# Patient Record
Sex: Female | Born: 1985
Health system: Southern US, Community
[De-identification: ages and names within clinical notes are randomized; demographics above are authoritative.]

## PROBLEM LIST (undated history)

## (undated) DIAGNOSIS — I10 Essential (primary) hypertension: Secondary | ICD-10-CM

## (undated) DIAGNOSIS — E282 Polycystic ovarian syndrome: Secondary | ICD-10-CM

## (undated) DIAGNOSIS — N970 Female infertility associated with anovulation: Secondary | ICD-10-CM

## (undated) DIAGNOSIS — N883 Incompetence of cervix uteri: Secondary | ICD-10-CM

## (undated) DIAGNOSIS — E079 Disorder of thyroid, unspecified: Secondary | ICD-10-CM

## (undated) DIAGNOSIS — E039 Hypothyroidism, unspecified: Secondary | ICD-10-CM

## (undated) DIAGNOSIS — K8020 Calculus of gallbladder without cholecystitis without obstruction: Secondary | ICD-10-CM

## (undated) HISTORY — DX: Hypothyroidism, unspecified: E03.9

## (undated) HISTORY — DX: Incompetence of cervix uteri: N88.3

## (undated) HISTORY — DX: Polycystic ovarian syndrome: E28.2

## (undated) HISTORY — PX: CERVICAL CERCLAGE: SHX1329

## (undated) HISTORY — DX: Calculus of gallbladder without cholecystitis without obstruction: K80.20

## (undated) HISTORY — DX: Female infertility associated with anovulation: N97.0

## (undated) HISTORY — DX: Essential (primary) hypertension: I10

---

## 2012-06-28 HISTORY — PX: CERVICAL CERCLAGE: SHX1329

## 2018-07-06 ENCOUNTER — Ambulatory Visit (HOSPITAL_COMMUNITY)
Admission: EM | Admit: 2018-07-06 | Discharge: 2018-07-06 | Disposition: A | Discharge status: Home / Self Care | Payer: BLUE CROSS/BLUE SHIELD | Attending: Family Medicine | Admitting: Family Medicine

## 2018-07-06 ENCOUNTER — Other Ambulatory Visit: Payer: Self-pay

## 2018-07-06 ENCOUNTER — Encounter (HOSPITAL_COMMUNITY): Payer: Self-pay

## 2018-07-06 DIAGNOSIS — R220 Localized swelling, mass and lump, head: Secondary | ICD-10-CM | POA: Diagnosis not present

## 2018-07-06 DIAGNOSIS — Z3202 Encounter for pregnancy test, result negative: Secondary | ICD-10-CM

## 2018-07-06 HISTORY — DX: Disorder of thyroid, unspecified: E07.9

## 2018-07-06 HISTORY — DX: Essential (primary) hypertension: I10

## 2018-07-06 LAB — POCT PREGNANCY, URINE: Preg Test, Ur: NEGATIVE

## 2018-07-06 MED ORDER — AMOXICILLIN-POT CLAVULANATE 875-125 MG PO TABS: 1.0000 | ORAL_TABLET | Freq: Two times a day (BID) | ORAL | 0 refills | Status: DC

## 2018-07-06 NOTE — ED Provider Notes (Signed)
[facility]    CSN: [id number] Arrival date & time: 07/06/18  1345     History   Chief Complaint Chief Complaint  Patient presents with  . Otalgia  . Sore Throat    HPI Angelica Birdena CrandallSrikanth Harris is a 33 y.o. female.   Patient is a 33 year old female with past medical history of hypertension, thyroid disease.  She presents with approximately 2 days of right facial pain just below the right ear and sore throat.  She has had some facial swelling also.  Feels as if her symptoms are getting worse.  She denies any fever, body aches, night sweats.  She denies any cough, congestion, runny nose.  She is having some pain with swallowing but not having any trouble swallowing or holding secretions.  No trismus.  Denies any dental pain or dental problems.   ROS per HPI      Past Medical History:  Diagnosis Date  . Hypertension   . Thyroid disease     There are no active problems to display for this patient.   History reviewed. No pertinent surgical history.  OB History   No obstetric history on file.      Home Medications    Prior to Admission medications   Medication Sig Start Date End Date Taking? Authorizing Provider  amlodipine-atorvastatin (CADUET) 10-20 MG tablet Take 1 tablet by mouth daily.   Yes [provider]  levothyroxine (SYNTHROID, LEVOTHROID) 125 MCG tablet Take 125 mcg by mouth daily before breakfast.   Yes [provider]  metFORMIN (GLUCOPHAGE) 500 MG tablet Take by mouth 2 (two) times daily with a meal.   Yes [provider]  amoxicillin-clavulanate (AUGMENTIN) 875-125 MG tablet Take 1 tablet by mouth every 12 (twelve) hours. 07/06/18   Janace ArisBast, Deanndra Kirley A, NP    Family History Family History  Problem Relation Age of Onset  . Hypertension Mother   . Diabetes Father   . Thyroid disease Father     Social History Social History   Tobacco Use  . Smoking status: Never Smoker  . Smokeless tobacco: Never Used    Substance Use Topics  . Alcohol use: Never    Frequency: Never  . Drug use: Never     Allergies   Patient has no known allergies.   Review of Systems Review of Systems   Physical Exam Triage Vital Signs ED Triage Vitals  Enc Vitals Group     BP 07/06/18 1410 (!) 150/111     Pulse Rate 07/06/18 1410 83     Resp 07/06/18 1410 18     Temp 07/06/18 1410 98.3 F (36.8 C)     Temp src --      SpO2 07/06/18 1410 100 %     Weight --      Height --      Head Circumference --      Peak Flow --      Pain Score 07/06/18 1404 7     Pain Loc --      Pain Edu? --      Excl. in GC? --    No data found.  Updated Vital Signs BP (!) 150/111 (BP Location: Right Arm)   Pulse 83   Temp 98.3 F (36.8 C)   Resp 18   LMP 04/23/2018   SpO2 100%   Visual Acuity Right Eye Distance:   Left Eye Distance:   Bilateral Distance:    Right Eye Near:   Left Eye Near:  Bilateral Near:     Physical Exam Vitals signs and nursing note reviewed.  Constitutional:      General: She is not in acute distress.    Appearance: She is well-developed. She is not ill-appearing, toxic-appearing or diaphoretic.  HENT:     Head: Normocephalic and atraumatic.     Comments: Mild swelling and tenderness just below the right ear extending slightly into jaw area.  No tenderness to mastoid or tragus. No trismus No gingival swelling, erythema or abscess    Right Ear: Tympanic membrane and ear canal normal.     Left Ear: Tympanic membrane and ear canal normal.     Mouth/Throat:     Tonsils: No tonsillar exudate. Swelling: 1+ on the right. 1+ on the left.  Eyes:     Conjunctiva/sclera: Conjunctivae normal.  Neck:     Musculoskeletal: Normal range of motion.  Cardiovascular:     Rate and Rhythm: Normal rate and regular rhythm.     Heart sounds: Normal heart sounds.  Pulmonary:     Effort: Pulmonary effort is normal.     Breath sounds: Normal breath sounds.  Lymphadenopathy:     Cervical:  Cervical adenopathy present.  Skin:    General: Skin is warm and dry.  Neurological:     Mental Status: She is alert.  Psychiatric:        Mood and Affect: Mood normal.      UC Treatments / Results  Labs (all labs ordered are listed, but only abnormal results are displayed) Labs Reviewed  POC URINE PREG, ED  POCT PREGNANCY, URINE    EKG None  Radiology No results found.  Procedures Procedures (including critical care time)  Medications Ordered in UC Medications - No data to display  Initial Impression / Assessment and Plan / UC Course  I have reviewed the triage vital signs and the nursing notes.  Pertinent labs & imaging results that were available during my care of the patient were reviewed by me and considered in my medical decision making (see chart for details).     We will treat for parotitis versus sialadenitis vs dental infection Will treat with Augmentin and have the patient monitor her symptoms.  Strict precautions that if her symptoms worsen she needs to reevaluated. If much worse she needs to to go to the ER for further management. She can take naproxen for pain and inflammation.  Follow up as needed for continued or worsening symptoms  Final Clinical Impressions(s) / UC Diagnoses   Final diagnoses:  Facial swelling     Discharge Instructions     We are treating for possible parotitis. Start taking the antibiotics today If worse or not better in the next few days please let use know. If the swelling gets significantly worse then you need to go to the ER.  You can also take naproxen 500 mg every 12 hours for pain and inflammation.     ED Prescriptions    Medication Sig Dispense Auth. Provider   amoxicillin-clavulanate (AUGMENTIN) 875-125 MG tablet Take 1 tablet by mouth every 12 (twelve) hours. 14 tablet Dahlia Byes A, NP     Controlled Substance Prescriptions [place] Controlled Substance Registry consulted? Not Applicable   Janace Aris,  NP 07/06/18 1708

## 2018-07-06 NOTE — Discharge Instructions (Addendum)
We are treating for possible parotitis. Start taking the antibiotics today If worse or not better in the next few days please let use know. If the swelling gets significantly worse then you need to go to the ER.  You can also take naproxen 500 mg every 12 hours for pain and inflammation.

## 2018-07-06 NOTE — ED Triage Notes (Signed)
Pt cc she has ear discomfort and sore throat. This started yesterday.

## 2018-07-17 ENCOUNTER — Ambulatory Visit: Payer: BLUE CROSS/BLUE SHIELD | Admitting: Family Medicine

## 2018-07-17 ENCOUNTER — Other Ambulatory Visit: Payer: Self-pay

## 2018-07-17 ENCOUNTER — Encounter: Payer: Self-pay | Admitting: Family Medicine

## 2018-07-17 VITALS — BP 136/76 | HR 91 | Temp 99.1°F | Resp 16 | Ht 63.0 in | Wt 164.2 lb

## 2018-07-17 DIAGNOSIS — E038 Other specified hypothyroidism: Secondary | ICD-10-CM

## 2018-07-17 DIAGNOSIS — K8020 Calculus of gallbladder without cholecystitis without obstruction: Secondary | ICD-10-CM

## 2018-07-17 DIAGNOSIS — E039 Hypothyroidism, unspecified: Secondary | ICD-10-CM | POA: Diagnosis not present

## 2018-07-17 DIAGNOSIS — I10 Essential (primary) hypertension: Secondary | ICD-10-CM | POA: Diagnosis not present

## 2018-07-17 DIAGNOSIS — E282 Polycystic ovarian syndrome: Secondary | ICD-10-CM

## 2018-07-17 DIAGNOSIS — N970 Female infertility associated with anovulation: Secondary | ICD-10-CM | POA: Diagnosis not present

## 2018-07-17 HISTORY — DX: Other specified hypothyroidism: E03.8

## 2018-07-17 HISTORY — DX: Essential (primary) hypertension: I10

## 2018-07-17 HISTORY — DX: Calculus of gallbladder without cholecystitis without obstruction: K80.20

## 2018-07-17 NOTE — Patient Instructions (Signed)
Please return in 6-8 weeks to recheck your blood pressure.  You can check your blood pressure at the pharmacy in the interim, write down the numbers and please bring them in for my review.   Please sign up for my chart.  Please start taking a multivitamin daily. This is important if you are trying to get pregnant because of the folic acid levels.   It was a pleasure meeting you today! Thank you for choosing [place] to meet your healthcare needs! I truly look forward to working with you. If you have any questions or concerns, please send me a message via Mychart or call the office at [phone].

## 2018-07-17 NOTE — Progress Notes (Signed)
Subjective  CC:  Chief Complaint  Patient presents with  . Establish Care    Was recently moved here in October. Last CPE was 02/2018  . Hypertension    Takes Amlodipine 5mg   . Hypothyroidism    Takes Levothyroxine  . PCOS    Metformin 500mg  daily    HPI: Angelica Harris is a 33 y.o. female who presents to [facility] today to establish care with me as a new patient.   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  I reviewed recent notes from her primary care provider.  Last physical was in September.  She reports normal lab work at that time.  He has history of hypertension on low-dose amlodipine.  She reports that it recently was checked and has been normal.  No complications of high blood pressure.  Tries to maintain a good weight although she is gained a little bit of weight recently.  History of subclinical hypothyroidism on low-dose supplementation, mainly due to help with fertility issues.  She and her husband are trying to have another child.  She reports her TSH was normal when last checked in September.  PCOS and infertility: On metformin for cycle regulation.  Trying to get pregnant.  First pregnancy was complicated by incompetent cervix status post cerclage and cesarean section.  She is a G2P10 1 1 with history of a SAB.  She has no history of diabetes or prediabetes.  She has a history of asymptomatic cholelithiasis found on an ultrasound.  Female wellness is up-to-date.  Immunizations are up-to-date.  Assessment  1. Essential hypertension   2. PCOS (polycystic ovarian syndrome)   3. Subclinical hypothyroidism   4. Infertility associated with anovulation   5. Calculus of gallbladder without cholecystitis without obstruction      Plan   Hypertension is fairly well controlled.  Continue current  medications.  Check outside of the office as needed.  Follow-up in 2 months for recheck.  Adjust medications at that time if indicated.  PCOS with infertility on metformin: Family-planning discussed.  Recommend multivitamin or prenatal vitamin with folic acid.  May need referral to reproductive endocrinologist.  Subclinical hypothyroidism: Continue low-dose supplementation.  Euthyroid clinically.  Recheck levels at her complete physical.  Reviewed blood work once I get it from her old office.  Asymptomatic gallstones: Monitor for now.  No further work-up indicated.  Follow up:  Return in about 8 weeks (around 09/11/2018) for follow up Hypertension. No orders of the defined types were placed in this encounter.  No orders of the defined types were placed in this encounter.    Depression screen PHQ 2/9 07/17/2018  Decreased Interest 0  Down, Depressed, Hopeless 0  PHQ - 2 Score 0    We updated and reviewed the patient's past history in detail and it is documented below.  Patient Active Problem List   Diagnosis Date Noted  . Subclinical hypothyroidism 07/17/2018  . Infertility associated with anovulation 07/17/2018  . Essential hypertension 07/17/2018  . Cholelithiases - asymptomatic 07/17/2018    By ultrasound   . PCOS (polycystic ovarian syndrome)    Health Maintenance  Topic Date Due  . HIV Screening  03/22/1999  . PAP SMEAR-Modifier  07/17/2020  . TETANUS/TDAP  02/23/2023  . INFLUENZA VACCINE  Completed    There is no immunization history on file for this patient. Current Meds  Medication Sig  . amLODipine (NORVASC) 2.5 MG tablet Take 1 tablet by mouth 2 (two) times daily.  . cholecalciferol (VITAMIN D3) 25 MCG (1000 UT) tablet Take 1,000 Units by mouth daily.  . ferrous sulfate 325 (65 FE) MG EC tablet Take 325 mg by mouth daily with breakfast.  . levothyroxine (SYNTHROID, LEVOTHROID) 25 MCG tablet Take 1 tablet by mouth once.  . loratadine (CLARITIN) 10 MG tablet Take 1  tablet by mouth daily.  . metFORMIN (GLUCOPHAGE) 500 MG tablet Take by mouth 2 (two) times daily with a meal.    Allergies: Patient has No Known Allergies. Past Medical History Patient  has a past medical history of Cervical incompetence, Cholelithiases - asymptomatic (07/17/2018), Essential hypertension (07/17/2018), Hypertension, Infertility associated with anovulation, PCOS (polycystic ovarian syndrome), Subclinical hypothyroidism (07/17/2018), and Thyroid disease. Past Surgical History Patient  has a past surgical history that includes Cervical cerclage and Cesarean section. Family History: Patient family history includes Diabetes in her father; Healthy in her daughter; Hypertension in her mother; Thyroid disease in her father. Social History:  Patient  reports that she has never smoked. She has never used smokeless tobacco. She reports that she does not drink alcohol or use drugs.  Review of Systems: Constitutional: negative for fever or malaise Ophthalmic: negative for photophobia, double vision or loss of vision Cardiovascular: negative for chest pain, dyspnea on exertion, or new LE swelling Respiratory: negative for SOB or persistent cough Gastrointestinal: negative for abdominal pain, change in bowel habits or melena Genitourinary: negative for dysuria or gross hematuria Musculoskeletal: negative for new gait disturbance or muscular weakness Integumentary: negative for new or persistent rashes Neurological: negative for TIA or stroke symptoms Psychiatric: negative for SI or delusions Allergic/Immunologic: negative for hives  Patient Care Team    Relationship Specialty Notifications Start End  Willow Ora, MD PCP - General Family Medicine  07/17/18     Objective  Vitals: BP 136/76   Pulse 91   Temp 99.1 F (37.3 C) (Oral)   Resp 16   Ht 5\' 3"  (1.6 m)   Wt 164 lb 3.2 oz (74.5 kg)   LMP 07/07/2018   SpO2 98%   BMI 29.09 kg/m  General:  Well developed, well  nourished, no acute distress  Psych:  Alert and oriented,normal mood and affect HEENT:  Normocephalic, atraumatic, non-icteric sclera, PERRL, oropharynx is without mass or exudate, supple neck without adenopathy, mass or thyromegaly Cardiovascular:  RRR without gallop, rub or murmur, nondisplaced PMI Respiratory:  Good breath sounds bilaterally, CTAB with normal respiratory effort Gastrointestinal: normal bowel sounds, soft, non-tender, no noted masses. No HSM MSK: no deformities, contusions. Joints are without erythema or swelling Skin:  Warm, no rashes or suspicious lesions noted Neurologic:    Mental status is normal. Gross motor and sensory exams are normal. Normal gait   Commons side effects, risks, benefits, and alternatives for medications and treatment plan prescribed today were discussed, and the patient expressed understanding of the given instructions. Patient is instructed to call or message via MyChart if he/she has any questions or concerns regarding our treatment plan. No barriers to understanding were identified. We discussed Red Flag symptoms and signs in detail. Patient expressed understanding regarding what to do in case of urgent or emergency type symptoms.   Medication list was reconciled, printed and provided to the patient in AVS. Patient instructions and summary information was reviewed with the patient as documented in the AVS. This note was prepared with assistance of Dragon voice  recognition software. Occasional wrong-word or sound-a-like substitutions may have occurred due to the inherent limitations of voice recognition software

## 2018-08-25 ENCOUNTER — Ambulatory Visit: Payer: Self-pay | Admitting: *Deleted

## 2018-08-25 NOTE — Telephone Encounter (Signed)
Patient called in and stated her daughter was diagnosed with the flu yesterday and wants to know if she can get a script for tamiflu    Patient is trying for pregnancy and she has not been using protection. Patient states she has had the flu shot this year. Patient's child is 35 years old and she will be the main caregiver.  Patient informed that the [facility] provider are ill and the office is not seeing patients today. Call to [facility] office as alternative resource- Phineas Semen has agreed to check with provider to see if it is feasible to Rx for patient. Patient is aware and will expect notification of response from provider.  Reason for Disposition . [1] Influenza EXPOSURE within last 48 hours (2 days) AND [2] NOT HIGH RISK AND [3] strongly requests antiviral medication  Answer Assessment - Initial Assessment Questions 1. TYPE of EXPOSURE: "How were you exposed?" (e.g., close contact, not a close contact)     Close contact- daughter 2. DATE of EXPOSURE: "When did the exposure occur?" (e.g., hour, days, weeks)     Diagnosed with flu today- confirmed 3. PREGNANCY: "Is there any chance you are pregnant?" "When was your last menstrual period?"     Yes- LMP- 08/07/18- patient actively trying for pregnancy 4. HIGH RISK for COMPLICATIONS: "Do you have any heart or lung problems? Do you have a weakened immune system?" (e.g., CHF, COPD, asthma, HIV positive, chemotherapy, renal failure, diabetes mellitus, sickle cell anemia)     High blood pressure  5. SYMPTOMS: "Do you have any symptoms?" (e.g., cough, fever, sore throat, difficulty breathing).     no  Protocols used: INFLUENZA EXPOSURE-A-AH

## 2018-08-28 NOTE — Telephone Encounter (Signed)
Please call to see if she remains fine. If doing fine and daughter is improving, would not recommend course of tamiflu for prevention at this time.  Apologize if call hadn't been handled in my absence.

## 2018-08-28 NOTE — Telephone Encounter (Signed)
She reports that her daughter is feeling better and that her and her husband both have not had any symptoms.

## 2018-09-04 ENCOUNTER — Encounter: Payer: Self-pay | Admitting: Family Medicine

## 2018-09-04 ENCOUNTER — Other Ambulatory Visit: Payer: Self-pay

## 2018-09-04 ENCOUNTER — Ambulatory Visit: Payer: BLUE CROSS/BLUE SHIELD | Admitting: Family Medicine

## 2018-09-04 VITALS — BP 122/84 | HR 93 | Temp 98.5°F | Resp 16 | Ht 63.0 in | Wt 164.8 lb

## 2018-09-04 DIAGNOSIS — N970 Female infertility associated with anovulation: Secondary | ICD-10-CM

## 2018-09-04 DIAGNOSIS — I10 Essential (primary) hypertension: Secondary | ICD-10-CM | POA: Diagnosis not present

## 2018-09-04 MED ORDER — LABETALOL HCL 100 MG PO TABS: 100.0000 mg | ORAL_TABLET | Freq: Two times a day (BID) | ORAL | 1 refills | Status: DC

## 2018-09-04 NOTE — Patient Instructions (Signed)
Please return in 6-8 weeks to recheck your blood pressure on the new medication.   Please stop the amlodipine.  Start labetalol twice a day. This medication is safe for use in pregnancy and should control your blood pressure.  Let me know if you have any problems.   If you have any questions or concerns, please don't hesitate to send me a message via MyChart or call the office at [phone]. Thank you for visiting with [place] today! It's our pleasure caring for you.

## 2018-09-04 NOTE — Progress Notes (Signed)
Subjective  CC:  Chief Complaint  Patient presents with  . Hypertension    HPI: Angelica Harris is a 33 y.o. female who presents to the office today to address the problems listed above in the chief complaint.  Hypertension f/u: Control is good . Pt reports she is doing well. taking medications as instructed, no medication side effects noted, no TIAs, no chest pain on exertion, no dyspnea on exertion, no swelling of ankles. She is trying to conceive though. Menstrual cycle is due tomorrow. She doesn't feel the typical predromal symptoms. She is hopeful. She denies adverse effects from his BP medications. Compliance with medication is good.   Assessment  1. Essential hypertension   2. Infertility associated with anovulation      Plan    Hypertension f/u: BP control is well controlled. However, due to trying to conceive, will change to labetalol 100 bid. Recheck 6-8 weeks.   Infertility history: rec checking home pregnancy test in 7 to 10 days if.  Has not started yet.  She is on a prenatal vitamin. Education regarding management of these chronic disease states was given. Management strategies discussed on successive visits include dietary and exercise recommendations, goals of achieving and maintaining IBW, and lifestyle modifications aiming for adequate sleep and minimizing stressors.   Follow up: Return in about 6 weeks (around 10/16/2018) for follow up Hypertension.  No orders of the defined types were placed in this encounter.  Meds ordered this encounter  Medications  . labetalol (NORMODYNE) 100 MG tablet    Sig: Take 1 tablet (100 mg total) by mouth 2 (two) times daily.    Dispense:  180 tablet    Refill:  1      BP Readings from Last 3 Encounters:  09/04/18 122/84  07/17/18 136/76  07/06/18 (!) 150/111   Wt Readings from Last 3 Encounters:  09/04/18 164 lb 12.8 oz (74.8 kg)  07/17/18 164 lb 3.2 oz (74.5 kg)    I reviewed the patients updated PMH,  FH, and SocHx.    Patient Active Problem List   Diagnosis Date Noted  . Subclinical hypothyroidism 07/17/2018    Priority: High  . Essential hypertension 07/17/2018    Priority: High  . Infertility associated with anovulation 07/17/2018    Priority: Medium  . Cholelithiases - asymptomatic 07/17/2018    Priority: Medium  . PCOS (polycystic ovarian syndrome)     Priority: Medium    Allergies: Patient has no known allergies.  Social History: Patient  reports that she has never smoked. She has never used smokeless tobacco. She reports that she does not drink alcohol or use drugs.  Current Meds  Medication Sig  . cholecalciferol (VITAMIN D3) 25 MCG (1000 UT) tablet Take 1,000 Units by mouth daily.  . ferrous sulfate 325 (65 FE) MG EC tablet Take 325 mg by mouth daily with breakfast.  . levothyroxine (SYNTHROID, LEVOTHROID) 25 MCG tablet Take 1 tablet by mouth once.  . loratadine (CLARITIN) 10 MG tablet Take 1 tablet by mouth daily.  . metFORMIN (GLUCOPHAGE) 500 MG tablet Take by mouth 2 (two) times daily with a meal.  . [DISCONTINUED] amLODipine (NORVASC) 2.5 MG tablet Take 1 tablet by mouth 2 (two) times daily.    Review of Systems: Cardiovascular: negative for chest pain, palpitations, leg swelling, orthopnea Respiratory: negative for SOB, wheezing or persistent cough Gastrointestinal: negative for abdominal pain Genitourinary: negative for dysuria or gross hematuria  Objective  Vitals: BP 122/84   Pulse 93  Temp 98.5 F (36.9 C) (Oral)   Resp 16   Ht 5\' 3"  (1.6 m)   Wt 164 lb 12.8 oz (74.8 kg)   LMP 08/07/2018   SpO2 99%   BMI 29.19 kg/m  General: no acute distress  Psych:  Alert and oriented, normal mood and affect HEENT:  Normocephalic, atraumatic, supple neck  Cardiovascular:  RRR without murmur. no edema Respiratory:  Good breath sounds bilaterally, CTAB with normal respiratory effort Skin:  Warm, no rashes Neurologic:   Mental status is normal  Commons  side effects, risks, benefits, and alternatives for medications and treatment plan prescribed today were discussed, and the patient expressed understanding of the given instructions. Patient is instructed to call or message via MyChart if he/she has any questions or concerns regarding our treatment plan. No barriers to understanding were identified. We discussed Red Flag symptoms and signs in detail. Patient expressed understanding regarding what to do in case of urgent or emergency type symptoms.   Medication list was reconciled, printed and provided to the patient in AVS. Patient instructions and summary information was reviewed with the patient as documented in the AVS. This note was prepared with assistance of Dragon voice recognition software. Occasional wrong-word or sound-a-like substitutions may have occurred due to the inherent limitations of voice recognition software

## 2018-09-06 ENCOUNTER — Telehealth: Payer: Self-pay | Admitting: Family Medicine

## 2018-09-06 NOTE — Telephone Encounter (Signed)
She states that she has not taking this medication for 7-8 months, she had had dizziness and fatigue. She does not want to try medication again.

## 2018-09-06 NOTE — Telephone Encounter (Signed)
Can she remember how long ago it was and would she be willing to retry it?? This is a safe bp medication for pregnancy.  Dr. Mardelle Matte

## 2018-09-06 NOTE — Telephone Encounter (Signed)
Please advise 

## 2018-09-06 NOTE — Telephone Encounter (Signed)
Copied from CRM [id number]. Topic: Quick Communication - Rx Refill/Question >> Sep 06, 2018  9:49 AM [name] wrote: Medication: a different bp med  Pt states the dr changed her bp med to labetalol (NORMODYNE) 100 MG tablet on 3/09.  Pt states she now remembers she has tried this med in the past, and it did not work for her.   She did not fell good with it. Pt has not picked it [facility] from [facility].  Requesting a different medication  [phone] [place], [place] - [address] [phone] (Phone) [phone] (Fax)

## 2018-09-07 MED ORDER — NIFEDIPINE ER OSMOTIC RELEASE 30 MG PO TB24: 30.0000 mg | ORAL_TABLET | Freq: Every day | ORAL | 1 refills | Status: DC

## 2018-09-07 NOTE — Telephone Encounter (Signed)
Ok. Changed to procardia xl 30mg  daily. F/u as directed.

## 2018-09-07 NOTE — Telephone Encounter (Signed)
Patient aware of new medication called into pharmacy. ?

## 2018-10-19 ENCOUNTER — Other Ambulatory Visit: Payer: Self-pay

## 2018-10-19 ENCOUNTER — Encounter: Payer: Self-pay | Admitting: Family Medicine

## 2018-10-19 ENCOUNTER — Ambulatory Visit (INDEPENDENT_AMBULATORY_CARE_PROVIDER_SITE_OTHER): Payer: BLUE CROSS/BLUE SHIELD | Admitting: Family Medicine

## 2018-10-19 DIAGNOSIS — N970 Female infertility associated with anovulation: Secondary | ICD-10-CM | POA: Diagnosis not present

## 2018-10-19 DIAGNOSIS — I10 Essential (primary) hypertension: Secondary | ICD-10-CM | POA: Diagnosis not present

## 2018-10-19 DIAGNOSIS — E282 Polycystic ovarian syndrome: Secondary | ICD-10-CM

## 2018-10-19 NOTE — Progress Notes (Signed)
Virtual Visit via Video Note  Subjective  CC:  Chief Complaint  Patient presents with  . Hypertension     I connected with Angelica Harris on 10/19/18 at 10:40 AM EDT by a video enabled telemedicine application and verified that I am speaking with the correct person using two identifiers. Location patient: Home [facility] provider[facility] [place] [place] Persons participating in the virtual visit: Angelica Harris, Angelica Oraamille L Laiylah Roettger, MD Rita Oharaiara Simmons, CMA  I discussed the limitations of evaluation and management by telemedicine and the availability of in person appointments. The patient expressed understanding and agreed to proceed. HPI: Angelica Harris is a 33 y.o. female who was contacted today to address the problems listed above in the chief complaint. Hypertension f/u: f/u for bp med change: stopped amlodipine and started procardia since trying to conceive. Control is unknown, hasn't checked her bp since starting the procardia. Pt reports she is doing well. taking medications as instructed, no medication side effects noted, no TIAs, no chest pain on exertion, no dyspnea on exertion, no swelling of ankles. Feels well on new meds. BP had been well controlled on low dose amlodipine (doesn't tolerate aldomet).  She denies adverse effects from his BP medications. Compliance with medication is good.   LMP 6 weeks ago; one negative Upreg at home and no sxs of pregnancy.  BP Readings from Last 3 Encounters:  09/04/18 122/84  07/17/18 136/76  07/06/18 (!) 150/111   Wt Readings from Last 3 Encounters:  09/04/18 164 lb 12.8 oz (74.8 kg)  07/17/18 164 lb 3.2 oz (74.5 kg)    Assessment  1. Essential hypertension   2. PCOS (polycystic ovarian syndrome)   3. Infertility associated with anovulation      Plan   HTN:  Tolerating procardia; she will purchase a home bp cuff and send my some readings. I will adjust meds if needed based on  those readings. Continue same dose for now.   PCOS on metformin with occ missed menses. Rec rechecking Upreg to be certain.   Infertility on MVI I discussed the assessment and treatment plan with the patient. The patient was provided an opportunity to ask questions and all were answered. The patient agreed with the plan and demonstrated an understanding of the instructions.   The patient was advised to call back or seek an in-person evaluation if the symptoms worsen or if the condition fails to improve as anticipated. Follow up: Return in about 5 months (around 03/21/2019) for complete physical.  Visit date not found  No orders of the defined types were placed in this encounter.     I reviewed the patients updated PMH, FH, and SocHx.    Patient Active Problem List   Diagnosis Date Noted  . Subclinical hypothyroidism 07/17/2018    Priority: High  . Essential hypertension 07/17/2018    Priority: High  . Infertility associated with anovulation 07/17/2018    Priority: Medium  . Cholelithiases - asymptomatic 07/17/2018    Priority: Medium  . PCOS (polycystic ovarian syndrome)     Priority: Medium   Current Meds  Medication Sig  . cholecalciferol (VITAMIN D3) 25 MCG (1000 UT) tablet Take 1,000 Units by mouth daily.  . ferrous sulfate 325 (65 FE) MG EC tablet Take 325 mg by mouth daily with breakfast.  . levothyroxine (SYNTHROID, LEVOTHROID) 25 MCG tablet Take 1 tablet by mouth once.  . loratadine (CLARITIN) 10 MG tablet Take 1 tablet by mouth daily.  .Marland Kitchen  metFORMIN (GLUCOPHAGE) 500 MG tablet Take by mouth 2 (two) times daily with a meal.  . NIFEdipine (PROCARDIA-XL/NIFEDICAL-XL) 30 MG 24 hr tablet Take 1 tablet (30 mg total) by mouth daily.    Allergies: Patient has No Known Allergies. Family History: Patient family history includes Diabetes in her father; Healthy in her daughter; Hypertension in her mother; Thyroid disease in her father. Social History:  Patient  reports that she  has never smoked. She has never used smokeless tobacco. She reports that she does not drink alcohol or use drugs.  Review of Systems: Constitutional: Negative for fever malaise or anorexia Cardiovascular: negative for chest pain Respiratory: negative for SOB or persistent cough Gastrointestinal: negative for abdominal pain  OBJECTIVE Vitals: LMP 07/31/2018  General: no acute distress , A&Ox3 Appears well.   Angelica Ora, MD

## 2018-10-19 NOTE — Patient Instructions (Signed)
Please check your blood pressure on the new medication and send me a message.   Return in September for your complete physical.

## 2019-02-13 ENCOUNTER — Encounter: Payer: Self-pay | Admitting: Family Medicine

## 2019-02-13 MED ORDER — LEVOTHYROXINE SODIUM 25 MCG PO TABS: 25.0000 ug | ORAL_TABLET | Freq: Once | ORAL | 1 refills | Status: DC

## 2019-02-13 MED ORDER — METFORMIN HCL 500 MG PO TABS: 500.0000 mg | ORAL_TABLET | Freq: Two times a day (BID) | ORAL | 1 refills | Status: DC

## 2019-02-15 ENCOUNTER — Other Ambulatory Visit: Payer: Self-pay | Admitting: *Deleted

## 2019-02-15 MED ORDER — METFORMIN HCL 500 MG PO TABS: 500.0000 mg | ORAL_TABLET | Freq: Two times a day (BID) | ORAL | 1 refills | Status: DC

## 2019-03-17 ENCOUNTER — Encounter: Payer: Self-pay | Admitting: Family Medicine

## 2019-03-21 ENCOUNTER — Encounter: Payer: BLUE CROSS/BLUE SHIELD | Admitting: Family Medicine

## 2019-03-23 ENCOUNTER — Other Ambulatory Visit: Payer: Self-pay

## 2019-03-23 ENCOUNTER — Encounter: Payer: Self-pay | Admitting: Family Medicine

## 2019-03-23 ENCOUNTER — Ambulatory Visit (INDEPENDENT_AMBULATORY_CARE_PROVIDER_SITE_OTHER): Payer: BC Managed Care – PPO | Admitting: Family Medicine

## 2019-03-23 VITALS — BP 153/108 | HR 98 | Temp 98.2°F | Resp 16 | Ht 63.0 in | Wt 162.8 lb

## 2019-03-23 DIAGNOSIS — E039 Hypothyroidism, unspecified: Secondary | ICD-10-CM

## 2019-03-23 DIAGNOSIS — Z0000 Encounter for general adult medical examination without abnormal findings: Secondary | ICD-10-CM | POA: Diagnosis not present

## 2019-03-23 DIAGNOSIS — Z23 Encounter for immunization: Secondary | ICD-10-CM | POA: Diagnosis not present

## 2019-03-23 DIAGNOSIS — I10 Essential (primary) hypertension: Secondary | ICD-10-CM

## 2019-03-23 DIAGNOSIS — E038 Other specified hypothyroidism: Secondary | ICD-10-CM

## 2019-03-23 LAB — CBC WITH DIFFERENTIAL/PLATELET
Basophils Absolute: 0 10*3/uL (ref 0.0–0.1)
Basophils Relative: 0.3 % (ref 0.0–3.0)
Eosinophils Absolute: 0.2 10*3/uL (ref 0.0–0.7)
Eosinophils Relative: 2.1 % (ref 0.0–5.0)
HCT: 39.8 % (ref 36.0–46.0)
Hemoglobin: 13.4 g/dL (ref 12.0–15.0)
Lymphocytes Relative: 28.7 % (ref 12.0–46.0)
Lymphs Abs: 2.4 10*3/uL (ref 0.7–4.0)
MCHC: 33.7 g/dL (ref 30.0–36.0)
MCV: 88.5 fl (ref 78.0–100.0)
Monocytes Absolute: 0.6 10*3/uL (ref 0.1–1.0)
Monocytes Relative: 7.6 % (ref 3.0–12.0)
Neutro Abs: 5.1 10*3/uL (ref 1.4–7.7)
Neutrophils Relative %: 61.3 % (ref 43.0–77.0)
Platelets: 326 10*3/uL (ref 150.0–400.0)
RBC: 4.5 Mil/uL (ref 3.87–5.11)
RDW: 12.7 % (ref 11.5–15.5)
WBC: 8.3 10*3/uL (ref 4.0–10.5)

## 2019-03-23 LAB — COMPREHENSIVE METABOLIC PANEL
ALT: 44 U/L — ABNORMAL HIGH (ref 0–35)
AST: 29 U/L (ref 0–37)
Albumin: 4.5 g/dL (ref 3.5–5.2)
Alkaline Phosphatase: 51 U/L (ref 39–117)
BUN: 8 mg/dL (ref 6–23)
CO2: 27 mEq/L (ref 19–32)
Calcium: 10.5 mg/dL (ref 8.4–10.5)
Chloride: 100 mEq/L (ref 96–112)
Creatinine, Ser: 0.65 mg/dL (ref 0.40–1.20)
GFR: 103.73 mL/min (ref >60.00)
Glucose, Bld: 132 mg/dL — ABNORMAL HIGH (ref 70–99)
Potassium: 3.9 mEq/L (ref 3.5–5.1)
Sodium: 139 mEq/L (ref 135–145)
Total Bilirubin: 0.3 mg/dL (ref 0.2–1.2)
Total Protein: 6.9 g/dL (ref 6.0–8.3)

## 2019-03-23 LAB — TSH: TSH: 3.3 u[IU]/mL (ref 0.35–4.50)

## 2019-03-23 LAB — LDL CHOLESTEROL, DIRECT: Direct LDL: 115 mg/dL

## 2019-03-23 LAB — LIPID PANEL
Cholesterol: 174 mg/dL (ref 0–200)
HDL: 40.3 mg/dL (ref >39.00)
NonHDL: 134.02
Total CHOL/HDL Ratio: 4
Triglycerides: 365 mg/dL — ABNORMAL HIGH (ref 0.0–149.0)
VLDL: 73 mg/dL — ABNORMAL HIGH (ref 0.0–40.0)

## 2019-03-23 MED ORDER — NIFEDIPINE ER OSMOTIC RELEASE 60 MG PO TB24: 60.0000 mg | ORAL_TABLET | Freq: Every day | ORAL | 3 refills | Status: DC

## 2019-03-23 NOTE — Progress Notes (Signed)
Subjective  Chief Complaint  Patient presents with  . Annual Exam    Reports sh eis having white discharge instead of cycle  . Hypertension    Past 2 weeks BPs have been high, highest has been 171/115.Marland Kitchen Denies headaches and dizzines but, reports fatigue    HPI: Angelica Harris is a 33 y.o. female who presents to [place] at [place] today for a Female Wellness Visit.  She also has the concerns and/or needs as listed above in the chief complaint. These will be addressed in addition to the Health Maintenance Visit.   Wellness Visit: annual visit with health maintenance review and exam without Pap   Health maintenance: Pap smear up-to-date.  Feeling well.  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 Chronic disease management visit and/or acute problem visit:  Hypertension: We changed to Procardia 30 mg daily blood pressures are running elevated as noted above.  Averaging 140s over 100-110.  Asymptomatic.  Taking medications regularly.  Changed his pregnancy safe medication however she is not currently trying to conceive due to the above stressors.  Hypothyroidism stable without symptoms of low or high thyroid. Assessment  1. Annual physical exam   2. Essential hypertension   3. Subclinical hypothyroidism   4. Need for immunization against influenza      Plan  Female Wellness Visit:  Age appropriate Health Maintenance and Prevention measures were discussed with patient. Included topics are cancer screening recommendations, ways to keep healthy (see AVS) including dietary and exercise recommendations, regular eye and dental care, use of seat belts, and avoidance of moderate alcohol use and tobacco use.   BMI: discussed patient's BMI and encouraged positive lifestyle modifications to help get to or maintain a target BMI.  HM needs and immunizations were addressed and ordered. See  below for orders. See HM and immunization section for updates. Flu shot today  Routine labs and screening tests ordered including cmp, cbc and lipids where appropriate.  Discussed recommendations regarding Vit D and calcium supplementation (see AVS)  Chronic disease f/u and/or acute problem visit: (deemed necessary to be done in addition to the wellness visit):  HTN:  Uncontrolled. Increase dose of procardia and monitor at home. May increase in 3-4 weeks if remains above goal. Check renal function and electrolytes.  Hypothyroidism: recheck levels.   Follow up: Return in about 3 months (around 06/22/2019) for follow up Hypertension.   Orders Placed This Encounter  Procedures  . Flu Vaccine QUAD 36+ mos IM  . CBC with Differential/Platelet  . Comprehensive metabolic panel  . Lipid panel  . HIV Antibody (routine testing w rflx)  . TSH  . LDL cholesterol, direct   Meds ordered this encounter  Medications  . NIFEdipine (PROCARDIA XL/NIFEDICAL XL) 60 MG 24 hr tablet    Sig: Take 1 tablet (60 mg total) by mouth daily.    Dispense:  90 tablet    Refill:  3      Lifestyle: Body mass index is 28.84 kg/m. Wt Readings from Last 3 Encounters:  03/23/19 162 lb 12.8 oz (73.8 kg)  09/04/18 164 lb 12.8 oz (74.8 kg)  07/17/18 164 lb 3.2 oz (74.5 kg)   Patient Active Problem List   Diagnosis Date Noted  . Subclinical hypothyroidism 07/17/2018    Priority: High  . Essential hypertension 07/17/2018    Priority: High  . Infertility associated with anovulation 07/17/2018    Priority: Medium  .  Cholelithiases - asymptomatic 07/17/2018    Priority: Medium    By ultrasound   . PCOS (polycystic ovarian syndrome)     Priority: Medium   Health Maintenance  Topic Date Due  . HIV Screening  03/22/1999  . PAP SMEAR-Modifier  07/17/2020  . TETANUS/TDAP  02/23/2023  . INFLUENZA VACCINE  Completed   Immunization History  Administered Date(s) Administered  . Influenza,inj,Quad PF,6+ Mos  03/23/2019   We updated and reviewed the patient's past history in detail and it is documented below. Allergies: Patient  reports no history of alcohol use. Past Medical History Patient  has a past medical history of Cervical incompetence, Cholelithiases - asymptomatic (07/17/2018), Essential hypertension (07/17/2018), Hypertension, Infertility associated with anovulation, PCOS (polycystic ovarian syndrome), Subclinical hypothyroidism (07/17/2018), and Thyroid disease. Past Surgical History Patient  has a past surgical history that includes Cervical cerclage and Cesarean section. Social History   Socioeconomic History  . Marital status: Married    Spouse name: Not on file  . Number of children: 1  . Years of education: Not on file  . Highest education level: Not on file  Occupational History  . Occupation: stay at home mom  Social Needs  . Financial resource strain: Not on file  . Food insecurity    Worry: Not on file    Inability: Not on file  . Transportation needs    Medical: Not on file    Non-medical: Not on file  Tobacco Use  . Smoking status: Never Smoker  . Smokeless tobacco: Never Used  Substance and Sexual Activity  . Alcohol use: Never    Frequency: Never  . Drug use: Never  . Sexual activity: Yes    Birth control/protection: None  Lifestyle  . Physical activity    Days per week: Not on file    Minutes per session: Not on file  . Stress: Not on file  Relationships  . Social Musician on phone: Not on file    Gets together: Not on file    Attends religious service: Not on file    Active member of club or organization: Not on file    Attends meetings of clubs or organizations: Not on file    Relationship status: Not on file  Other Topics Concern  . Not on file  Social History Narrative  . Not on file   Family History  Problem Relation Age of Onset  . Hypertension Mother   . Diabetes Father   . Thyroid disease Father   . Healthy Daughter      Review of Systems: Constitutional: negative for fever or malaise Ophthalmic: negative for photophobia, double vision or loss of vision Cardiovascular: negative for chest pain, dyspnea on exertion, or new LE swelling Respiratory: negative for SOB or persistent cough Gastrointestinal: negative for abdominal pain, change in bowel habits or melena Genitourinary: negative for dysuria or gross hematuria, no abnormal uterine bleeding or disharge Musculoskeletal: negative for new gait disturbance or muscular weakness Integumentary: negative for new or persistent rashes, no breast lumps Neurological: negative for TIA or stroke symptoms Psychiatric: negative for SI or delusions Allergic/Immunologic: negative for hives  Patient Care Team    Relationship Specialty Notifications Start End  Willow Ora, MD PCP - General Family Medicine  07/17/18     Objective  Vitals: BP (!) 153/108 Comment: Home cuff (In Office)  Pulse 98   Temp 98.2 F (36.8 C) (Tympanic)   Resp 16   Ht 5\' 3"  (1.6  m)   Wt 162 lb 12.8 oz (73.8 kg)   LMP 02/17/2019   SpO2 99%   BMI 28.84 kg/m  General:  Well developed, well nourished, no acute distress  Psych:  Alert and orientedx3,normal mood and affect HEENT:  Normocephalic, atraumatic, non-icteric sclera, PERRL, oropharynx is clear without mass or exudate, supple neck without adenopathy, mass or thyromegaly Cardiovascular:  Normal S1, S2, RRR without gallop, rub or murmur, nondisplaced PMI Respiratory:  Good breath sounds bilaterally, CTAB with normal respiratory effort Gastrointestinal: normal bowel sounds, soft, non-tender, no noted masses. No HSM MSK: no deformities, contusions. Joints are without erythema or swelling. Spine and CVA region are nontender Skin:  Warm, no rashes or suspicious lesions noted Neurologic:    Mental status is normal. CN 2-11 are normal. Gross motor and sensory exams are normal. Normal gait. No tremor Breast Exam: No mass, skin retraction  or nipple discharge is appreciated in either breast. No axillary adenopathy. Fibrocystic changes are not noted     Commons side effects, risks, benefits, and alternatives for medications and treatment plan prescribed today were discussed, and the patient expressed understanding of the given instructions. Patient is instructed to call or message via MyChart if he/she has any questions or concerns regarding our treatment plan. No barriers to understanding were identified. We discussed Red Flag symptoms and signs in detail. Patient expressed understanding regarding what to do in case of urgent or emergency type symptoms.   Medication list was reconciled, printed and provided to the patient in AVS. Patient instructions and summary information was reviewed with the patient as documented in the AVS. This note was prepared with assistance of Dragon voice recognition software. Occasional wrong-word or sound-a-like substitutions may have occurred due to the inherent limitations of voice recognition software

## 2019-03-23 NOTE — Patient Instructions (Addendum)
Please return in 3 months for follow up of your hypertension.  I will release your lab results to you on your MyChart account with further instructions. Please reply with any questions.   Today you were given your flu vaccination.   If you have any questions or concerns, please don't hesitate to send me a message via MyChart or call the office at [phone]. Thank you for visiting with [place] today! It's our pleasure caring for you.   Preventive Care 44-13 Years Old, Female Preventive care refers to visits with your health care provider and lifestyle choices that can promote health and wellness. This includes:  A yearly physical exam. This may also be called an annual well check.  Regular dental visits and eye exams.  Immunizations.  Screening for certain conditions.  Healthy lifestyle choices, such as eating a healthy diet, getting regular exercise, not using drugs or products that contain nicotine and tobacco, and limiting alcohol use. What can I expect for my preventive care visit? Physical exam Your health care provider will check your:  Height and weight. This may be used to calculate body mass index (BMI), which tells if you are at a healthy weight.  Heart rate and blood pressure.  Skin for abnormal spots. Counseling Your health care provider may ask you questions about your:  Alcohol, tobacco, and drug use.  Emotional well-being.  Home and relationship well-being.  Sexual activity.  Eating habits.  Work and work Statistician.  Method of birth control.  Menstrual cycle.  Pregnancy history. What immunizations do I need?  Influenza (flu) vaccine  This is recommended every year. Tetanus, diphtheria, and pertussis (Tdap) vaccine  You may need a Td booster every 10 years. Varicella (chickenpox) vaccine  You may need this if you have not been vaccinated. Human papillomavirus (HPV) vaccine  If recommended by your health care provider, you may need three  doses over 6 months. Measles, mumps, and rubella (MMR) vaccine  You may need at least one dose of MMR. You may also need a second dose. Meningococcal conjugate (MenACWY) vaccine  One dose is recommended if you are age 73-21 years and a first-year college student living in a residence hall, or if you have one of several medical conditions. You may also need additional booster doses. Pneumococcal conjugate (PCV13) vaccine  You may need this if you have certain conditions and were not previously vaccinated. Pneumococcal polysaccharide (PPSV23) vaccine  You may need one or two doses if you smoke cigarettes or if you have certain conditions. Hepatitis A vaccine  You may need this if you have certain conditions or if you travel or work in places where you may be exposed to hepatitis A. Hepatitis B vaccine  You may need this if you have certain conditions or if you travel or work in places where you may be exposed to hepatitis B. Haemophilus influenzae type b (Hib) vaccine  You may need this if you have certain conditions. You may receive vaccines as individual doses or as more than one vaccine together in one shot (combination vaccines). Talk with your health care provider about the risks and benefits of combination vaccines. What tests do I need?  Blood tests  Lipid and cholesterol levels. These may be checked every 5 years starting at age 43.  Hepatitis C test.  Hepatitis B test. Screening  Diabetes screening. This is done by checking your blood sugar (glucose) after you have not eaten for a while (fasting).  Sexually transmitted disease (STD) testing.  BRCA-related cancer screening. This may be done if you have a family history of breast, ovarian, tubal, or peritoneal cancers.  Pelvic exam and Pap test. This may be done every 3 years starting at age 26. Starting at age 66, this may be done every 5 years if you have a Pap test in combination with an HPV test. Talk with your  health care provider about your test results, treatment options, and if necessary, the need for more tests. Follow these instructions at home: Eating and drinking   Eat a diet that includes fresh fruits and vegetables, whole grains, lean protein, and low-fat dairy.  Take vitamin and mineral supplements as recommended by your health care provider.  Do not drink alcohol if: ? Your health care provider tells you not to drink. ? You are pregnant, may be pregnant, or are planning to become pregnant.  If you drink alcohol: ? Limit how much you have to 0-1 drink a day. ? Be aware of how much alcohol is in your drink. In the [place] one drink equals one 12 oz bottle of beer (355 mL), one 5 oz glass of wine (148 mL), or one 1 oz glass of hard liquor (44 mL). Lifestyle  Take daily care of your teeth and gums.  Stay active. Exercise for at least 30 minutes on 5 or more days each week.  Do not use any products that contain nicotine or tobacco, such as cigarettes, e-cigarettes, and chewing tobacco. If you need help quitting, ask your health care provider.  If you are sexually active, practice safe sex. Use a condom or other form of birth control (contraception) in order to prevent pregnancy and STIs (sexually transmitted infections). If you plan to become pregnant, see your health care provider for a preconception visit. What's next?  Visit your health care provider once a year for a well check visit.  Ask your health care provider how often you should have your eyes and teeth checked.  Stay up to date on all vaccines. This information is not intended to replace advice given to you by your health care provider. Make sure you discuss any questions you have with your health care provider. Document Released: 08/10/2001 Document Revised: 02/23/2018 Document Reviewed: 02/23/2018 Elsevier Patient Education  2020 [employer].

## 2019-03-24 LAB — HIV ANTIBODY (ROUTINE TESTING W REFLEX): HIV 1&2 Ab, 4th Generation: NONREACTIVE

## 2019-03-26 ENCOUNTER — Other Ambulatory Visit: Payer: Self-pay | Admitting: *Deleted

## 2019-03-26 MED ORDER — METFORMIN HCL 500 MG PO TABS: 500.0000 mg | ORAL_TABLET | Freq: Two times a day (BID) | ORAL | 3 refills | Status: DC

## 2019-03-26 MED ORDER — LEVOTHYROXINE SODIUM 25 MCG PO TABS: 25.0000 ug | ORAL_TABLET | Freq: Once | ORAL | 3 refills | Status: DC

## 2019-04-08 ENCOUNTER — Other Ambulatory Visit: Payer: Self-pay | Admitting: Family Medicine

## 2019-06-13 ENCOUNTER — Other Ambulatory Visit: Payer: Self-pay

## 2019-06-13 MED ORDER — NIFEDIPINE ER OSMOTIC RELEASE 60 MG PO TB24: 60.0000 mg | ORAL_TABLET | Freq: Every day | ORAL | 3 refills | Status: DC

## 2019-06-19 ENCOUNTER — Ambulatory Visit: Payer: BC Managed Care – PPO | Admitting: Family Medicine

## 2019-08-08 ENCOUNTER — Encounter: Payer: Self-pay | Admitting: Family Medicine

## 2019-09-17 ENCOUNTER — Encounter: Payer: Self-pay | Admitting: Family Medicine

## 2019-09-17 ENCOUNTER — Ambulatory Visit: Payer: BC Managed Care – PPO | Admitting: Family Medicine

## 2019-09-17 ENCOUNTER — Other Ambulatory Visit: Payer: Self-pay

## 2019-09-17 VITALS — BP 148/76 | HR 76 | Temp 98.1°F | Ht 63.0 in | Wt 162.0 lb

## 2019-09-17 DIAGNOSIS — E282 Polycystic ovarian syndrome: Secondary | ICD-10-CM | POA: Diagnosis not present

## 2019-09-17 DIAGNOSIS — I10 Essential (primary) hypertension: Secondary | ICD-10-CM | POA: Diagnosis not present

## 2019-09-17 DIAGNOSIS — N930 Postcoital and contact bleeding: Secondary | ICD-10-CM

## 2019-09-17 NOTE — Progress Notes (Signed)
Subjective  CC:  Chief Complaint  Patient presents with  . Hypertension    does not check at home. denies HA, dizziness, or visual changes  . Vaginal Bleeding    on and off post intercourse for about 5 months. irregular periods. sex is not painful    HPI: Angelica Harris is a 34 y.o. female who presents to the office today to address the problems listed above in the chief complaint.  Hypertension f/u: Control is good  but patient hasn't checked her readings at home. Feels fine. Pt reports she is doing well. taking medications as instructed, no medication side effects noted, no TIAs, no chest pain on exertion, no dyspnea on exertion, no swelling of ankles. She denies adverse effects from his BP medications. Compliance with medication is good.   Has postcoital spotting on and off for last 6 months. No pelvic pain, vaginal discharge. Menses are mostly regular on metformin for PCOS but had one irregular light spotting cycle last month. LMP 3/07 and was normal. Not on birth control. Not yet trying to get pregnant. Has h/o infertility due to PCOS.   Assessment  1. Essential hypertension   2. Postcoital bleeding   3. PCOS (polycystic ovarian syndrome)      Plan    Hypertension f/u: BP control is fairly well controlled. Recommend checking bp at home to ensure it is under control. Adjust meds up if it isn't.   Postcoital bleeding: unclear etiology but no worrisome sxs or findings on exam. Suspect mechanical irritation from cervix. Will monitor. IF she develops irregular bleeding will rec ultrasound and/or gyn eval.  Education regarding management of these chronic disease states was given. Management strategies discussed on successive visits include dietary and exercise recommendations, goals of achieving and maintaining IBW, and lifestyle modifications aiming for adequate sleep and minimizing stressors.   Follow up: Return in about 6 months (around 03/19/2020) for complete  physical, follow up Hypertension.  No orders of the defined types were placed in this encounter.  No orders of the defined types were placed in this encounter.     BP Readings from Last 3 Encounters:  09/17/19 (!) 148/76  03/23/19 (!) 153/108  09/04/18 122/84   Wt Readings from Last 3 Encounters:  09/17/19 162 lb (73.5 kg)  03/23/19 162 lb 12.8 oz (73.8 kg)  09/04/18 164 lb 12.8 oz (74.8 kg)    Lab Results  Component Value Date   CHOL 174 03/23/2019   Lab Results  Component Value Date   HDL 40.30 03/23/2019   No results found for: [facility] Lab Results  Component Value Date   TRIG 365.0 (H) 03/23/2019   Lab Results  Component Value Date   CHOLHDL 4 03/23/2019   Lab Results  Component Value Date   LDLDIRECT 115.0 03/23/2019   Lab Results  Component Value Date   CREATININE 0.65 03/23/2019   BUN 8 03/23/2019   NA 139 03/23/2019   K 3.9 03/23/2019   CL 100 03/23/2019   CO2 27 03/23/2019    The ASCVD Risk score (Goff DC Jr., et al., 2013) failed to calculate for the following reasons:   The 2013 ASCVD risk score is only valid for ages 15 to 59  I reviewed the patients updated PMH, FH, and SocHx.    Patient Active Problem List   Diagnosis Date Noted  . Subclinical hypothyroidism 07/17/2018    Priority: High  . Essential hypertension 07/17/2018    Priority: High  . Infertility associated  with anovulation 07/17/2018    Priority: Medium  . Cholelithiases - asymptomatic 07/17/2018    Priority: Medium  . PCOS (polycystic ovarian syndrome)     Priority: Medium    Allergies: Labetalol hcl  Social History: Patient  reports that she has never smoked. She has never used smokeless tobacco. She reports that she does not drink alcohol or use drugs.  Current Meds  Medication Sig  . cholecalciferol (VITAMIN D3) 25 MCG (1000 UT) tablet Take 1,000 Units by mouth daily.  . ferrous sulfate 325 (65 FE) MG EC tablet Take 325 mg by mouth daily with breakfast.  .  levothyroxine (SYNTHROID) 25 MCG tablet Take 1 tablet (25 mcg total) by mouth once for 1 dose.  . metFORMIN (GLUCOPHAGE) 500 MG tablet Take 1 tablet (500 mg total) by mouth 2 (two) times daily with a meal.  . NIFEdipine (PROCARDIA XL/NIFEDICAL XL) 60 MG 24 hr tablet Take 1 tablet (60 mg total) by mouth daily.    Review of Systems: Cardiovascular: negative for chest pain, palpitations, leg swelling, orthopnea Respiratory: negative for SOB, wheezing or persistent cough Gastrointestinal: negative for abdominal pain Genitourinary: negative for dysuria or gross hematuria  Objective  Vitals: BP (!) 148/76 (BP Location: Right Arm, Patient Position: Sitting, Cuff Size: Normal)   Pulse 76   Temp 98.1 F (36.7 C) (Temporal)   Ht 5\' 3"  (1.6 m)   Wt 162 lb (73.5 kg)   LMP 09/02/2019   SpO2 98%   BMI 28.70 kg/m  General: no acute distress  Psych:  Alert and oriented, normal mood and affect HEENT:  Normocephalic, atraumatic, supple neck  Cardiovascular:  RRR without murmur. no edema Respiratory:  Good breath sounds bilaterally, CTAB with normal respiratory effort GYN: nl introitus, normal cervix w/o polyp or bleeding. No discharge. Nl fundus and adnexa  Commons side effects, risks, benefits, and alternatives for medications and treatment plan prescribed today were discussed, and the patient expressed understanding of the given instructions. Patient is instructed to call or message via MyChart if he/she has any questions or concerns regarding our treatment plan. No barriers to understanding were identified. We discussed Red Flag symptoms and signs in detail. Patient expressed understanding regarding what to do in case of urgent or emergency type symptoms.   Medication list was reconciled, printed and provided to the patient in AVS. Patient instructions and summary information was reviewed with the patient as documented in the AVS. This note was prepared with assistance of Dragon voice recognition  software. Occasional wrong-word or sound-a-like substitutions may have occurred due to the inherent limitations of voice recognition software  This visit occurred during the SARS-CoV-2 public health emergency.  Safety protocols were in place, including screening questions prior to the visit, additional usage of staff PPE, and extensive cleaning of exam room while observing appropriate contact time as indicated for disinfecting solutions.

## 2019-09-17 NOTE — Patient Instructions (Signed)
Please return in September 2021 for your annual complete physical; please come fasting.   Please check your blood pressures at home and send my a MyChart message with the numbers.   If you have any questions or concerns, please don't hesitate to send me a message via MyChart or call the office at [phone]. Thank you for visiting with [place] today! It's our pleasure caring for you.

## 2019-09-18 ENCOUNTER — Encounter: Payer: Self-pay | Admitting: Family Medicine

## 2019-09-18 DIAGNOSIS — I10 Essential (primary) hypertension: Secondary | ICD-10-CM

## 2019-09-18 MED ORDER — NIFEDIPINE ER OSMOTIC RELEASE 60 MG PO TB24: 60.0000 mg | ORAL_TABLET | Freq: Every day | ORAL | 3 refills | Status: DC

## 2019-10-05 ENCOUNTER — Encounter: Payer: Self-pay | Admitting: Family Medicine

## 2019-10-05 NOTE — Progress Notes (Unsigned)
Called patient to get her scheduled for Monday but patient was unable to take appointment and asked if we could put her in Tuesday @ 9 am.

## 2019-10-08 ENCOUNTER — Ambulatory Visit: Payer: BC Managed Care – PPO | Admitting: Family Medicine

## 2019-10-09 ENCOUNTER — Encounter: Payer: Self-pay | Admitting: Family Medicine

## 2019-10-09 ENCOUNTER — Ambulatory Visit (INDEPENDENT_AMBULATORY_CARE_PROVIDER_SITE_OTHER): Payer: BC Managed Care – PPO | Admitting: Family Medicine

## 2019-10-09 ENCOUNTER — Other Ambulatory Visit: Payer: Self-pay

## 2019-10-09 VITALS — BP 150/108 | HR 94 | Temp 98.3°F | Resp 15 | Ht 63.0 in | Wt 162.4 lb

## 2019-10-09 DIAGNOSIS — N930 Postcoital and contact bleeding: Secondary | ICD-10-CM

## 2019-10-09 DIAGNOSIS — E282 Polycystic ovarian syndrome: Secondary | ICD-10-CM | POA: Diagnosis not present

## 2019-10-09 DIAGNOSIS — I10 Essential (primary) hypertension: Secondary | ICD-10-CM | POA: Diagnosis not present

## 2019-10-09 DIAGNOSIS — N939 Abnormal uterine and vaginal bleeding, unspecified: Secondary | ICD-10-CM | POA: Diagnosis not present

## 2019-10-09 DIAGNOSIS — Z3009 Encounter for other general counseling and advice on contraception: Secondary | ICD-10-CM

## 2019-10-09 MED ORDER — NIFEDIPINE ER OSMOTIC RELEASE 60 MG PO TB24: 120.0000 mg | ORAL_TABLET | Freq: Every day | ORAL | 3 refills | Status: DC

## 2019-10-09 NOTE — Progress Notes (Signed)
Subjective  CC:  Chief Complaint  Patient presents with  . Hypertension    170/100's over the past several weeks, when BP high - patient doubled HTN medications, states that it helped lower BP to normal range    HPI: Angelica Harris is a 34 y.o. female who presents to the office today to address the problems listed above in the chief complaint.  Hypertension f/u: Feeling well. Taking medications w/o adverse effects. No symptoms of CHF, angina; no palpitations, sob, cp or lower extremity edema. Compliant with meds. However, bp is variable, and often elevated at home.   Vaginal spotting: spotting postcoitally and also with masturbation. Consistent for about a year. Doesn't have sex frequently so reports about 5-10 times over last year. No pain. No intermenstrual bleeding otherwise. Has PCOS and has had regular menses recently. No risk for STDs, no vaginal discharge  Family planning: ready to start trying for pregnancy. LMP 2 days ago. Not yet on pnv or mvi.   Assessment  1. Essential hypertension   2. Postcoital bleeding   3. Vaginal spotting   4. PCOS (polycystic ovarian syndrome)   5. Family planning      Plan    Hypertension f/u: BP control is poorly controlled. Double procardia XL to 120mg  daily (max). Monitor home readings daily. Recheck 6 weeks. Add aldomet if not yet at goal at that time.   irreg vag spotting: check sonohysterogram r/o polyp.   Family planning: start MVI now.   Education regarding management of these chronic disease states was given. Management strategies discussed on successive visits include dietary and exercise recommendations, goals of achieving and maintaining IBW, and lifestyle modifications aiming for adequate sleep and minimizing stressors.   Follow up: 6 weeks for HTN f/u  Orders Placed This Encounter  Procedures  . [place] Sonohysterogram   Meds ordered this encounter  Medications  . NIFEdipine (PROCARDIA XL/NIFEDICAL XL) 60 MG 24  hr tablet    Sig: Take 2 tablets (120 mg total) by mouth daily.    Dispense:  180 tablet    Refill:  3      BP Readings from Last 3 Encounters:  10/09/19 (!) 150/108  09/17/19 (!) 148/76  03/23/19 (!) 153/108   Wt Readings from Last 3 Encounters:  10/09/19 162 lb 6.4 oz (73.7 kg)  09/17/19 162 lb (73.5 kg)  03/23/19 162 lb 12.8 oz (73.8 kg)    Lab Results  Component Value Date   CHOL 174 03/23/2019   Lab Results  Component Value Date   HDL 40.30 03/23/2019   No results found for: [facility] Lab Results  Component Value Date   TRIG 365.0 (H) 03/23/2019   Lab Results  Component Value Date   CHOLHDL 4 03/23/2019   Lab Results  Component Value Date   LDLDIRECT 115.0 03/23/2019   Lab Results  Component Value Date   CREATININE 0.65 03/23/2019   BUN 8 03/23/2019   NA 139 03/23/2019   K 3.9 03/23/2019   CL 100 03/23/2019   CO2 27 03/23/2019    The ASCVD Risk score (Goff DC Jr., et al., 2013) failed to calculate for the following reasons:   The 2013 ASCVD risk score is only valid for ages 45 to 32  I reviewed the patients updated PMH, FH, and SocHx.    Patient Active Problem List   Diagnosis Date Noted  . Subclinical hypothyroidism 07/17/2018    Priority: High  . Essential hypertension 07/17/2018    Priority: High  .  Infertility associated with anovulation 07/17/2018    Priority: Medium  . Cholelithiases - asymptomatic 07/17/2018    Priority: Medium  . PCOS (polycystic ovarian syndrome)     Priority: Medium    Allergies: Labetalol hcl  Social History: Patient  reports that she has never smoked. She has never used smokeless tobacco. She reports that she does not drink alcohol or use drugs.  Current Meds  Medication Sig  . cholecalciferol (VITAMIN D3) 25 MCG (1000 UT) tablet Take 1,000 Units by mouth daily.  . ferrous sulfate 325 (65 FE) MG EC tablet Take 325 mg by mouth daily with breakfast.  . NIFEdipine (PROCARDIA XL/NIFEDICAL XL) 60 MG 24 hr tablet  Take 2 tablets (120 mg total) by mouth daily.  . [DISCONTINUED] NIFEdipine (PROCARDIA XL/NIFEDICAL XL) 60 MG 24 hr tablet Take 1 tablet (60 mg total) by mouth daily.    Review of Systems: Cardiovascular: negative for chest pain, palpitations, leg swelling, orthopnea Respiratory: negative for SOB, wheezing or persistent cough Gastrointestinal: negative for abdominal pain Genitourinary: negative for dysuria or gross hematuria  Objective  Vitals: BP (!) 150/108   Pulse 94   Temp 98.3 F (36.8 C) (Temporal)   Resp 15   Ht 5\' 3"  (1.6 m)   Wt 162 lb 6.4 oz (73.7 kg)   SpO2 98%   BMI 28.77 kg/m  General: no acute distress  Psych:  Alert and oriented, normal mood and affect Cardiovascular:  RRR without murmur. no edema Respiratory:  Good breath sounds bilaterally, CTAB with normal respiratory effort Neurologic:   Mental status is normal  Commons side effects, risks, benefits, and alternatives for medications and treatment plan prescribed today were discussed, and the patient expressed understanding of the given instructions. Patient is instructed to call or message via MyChart if he/she has any questions or concerns regarding our treatment plan. No barriers to understanding were identified. We discussed Red Flag symptoms and signs in detail. Patient expressed understanding regarding what to do in case of urgent or emergency type symptoms.   Medication list was reconciled, printed and provided to the patient in AVS. Patient instructions and summary information was reviewed with the patient as documented in the AVS. This note was prepared with assistance of Dragon voice recognition software. Occasional wrong-word or sound-a-like substitutions may have occurred due to the inherent limitations of voice recognition software  This visit occurred during the SARS-CoV-2 public health emergency.  Safety protocols were in place, including screening questions prior to the visit, additional usage of staff  PPE, and extensive cleaning of exam room while observing appropriate contact time as indicated for disinfecting solutions.

## 2019-10-09 NOTE — Patient Instructions (Signed)
Please return in 6 weeks to recheck blood pressure.   I am sending you for a special ultrasound of your uterus. We will call you to get this scheduled.  Start a multivitamin daily.  I am increasing your blood pressure medication to 2 tablets daily. Keep checking your blood pressures at home daily.   If you have any questions or concerns, please don't hesitate to send me a message via MyChart or call the office at [phone]. Thank you for visiting with [place] today! It's our pleasure caring for you.

## 2019-10-11 ENCOUNTER — Other Ambulatory Visit: Payer: Self-pay | Admitting: Family Medicine

## 2019-10-11 DIAGNOSIS — N939 Abnormal uterine and vaginal bleeding, unspecified: Secondary | ICD-10-CM

## 2019-10-11 DIAGNOSIS — E282 Polycystic ovarian syndrome: Secondary | ICD-10-CM

## 2019-10-11 DIAGNOSIS — N930 Postcoital and contact bleeding: Secondary | ICD-10-CM

## 2019-10-31 ENCOUNTER — Other Ambulatory Visit: Payer: Self-pay | Admitting: Family Medicine

## 2019-10-31 DIAGNOSIS — E282 Polycystic ovarian syndrome: Secondary | ICD-10-CM

## 2019-10-31 DIAGNOSIS — N930 Postcoital and contact bleeding: Secondary | ICD-10-CM

## 2019-10-31 DIAGNOSIS — N939 Abnormal uterine and vaginal bleeding, unspecified: Secondary | ICD-10-CM

## 2019-11-05 ENCOUNTER — Ambulatory Visit
Admission: RE | Admit: 2019-11-05 | Discharge: 2019-11-05 | Disposition: A | Discharge status: Home / Self Care | Payer: BC Managed Care – PPO | Source: Ambulatory Visit | Attending: Family Medicine | Admitting: Family Medicine

## 2019-11-05 ENCOUNTER — Telehealth: Payer: Self-pay | Admitting: Family Medicine

## 2019-11-05 DIAGNOSIS — E282 Polycystic ovarian syndrome: Secondary | ICD-10-CM

## 2019-11-05 DIAGNOSIS — N939 Abnormal uterine and vaginal bleeding, unspecified: Secondary | ICD-10-CM

## 2019-11-05 DIAGNOSIS — N838 Other noninflammatory disorders of ovary, fallopian tube and broad ligament: Secondary | ICD-10-CM | POA: Diagnosis not present

## 2019-11-05 DIAGNOSIS — N930 Postcoital and contact bleeding: Secondary | ICD-10-CM

## 2019-11-05 NOTE — Telephone Encounter (Signed)
States a pelvic was completed on patient and normal.   States endometrium was normal. Radiologist is questioning if they need to still proceed with a sono hysterogram?   Patient is still in office at GI.

## 2019-11-05 NOTE — Telephone Encounter (Signed)
Please advise 

## 2019-11-07 ENCOUNTER — Encounter: Payer: Self-pay | Admitting: Family Medicine

## 2019-11-21 ENCOUNTER — Ambulatory Visit (INDEPENDENT_AMBULATORY_CARE_PROVIDER_SITE_OTHER): Payer: BC Managed Care – PPO | Admitting: Family Medicine

## 2019-11-21 ENCOUNTER — Encounter: Payer: Self-pay | Admitting: Family Medicine

## 2019-11-21 ENCOUNTER — Other Ambulatory Visit: Payer: Self-pay

## 2019-11-21 VITALS — BP 114/80 | HR 108 | Temp 98.8°F | Resp 15 | Ht 63.0 in | Wt 164.2 lb

## 2019-11-21 DIAGNOSIS — I10 Essential (primary) hypertension: Secondary | ICD-10-CM

## 2019-11-21 DIAGNOSIS — N930 Postcoital and contact bleeding: Secondary | ICD-10-CM | POA: Diagnosis not present

## 2019-11-21 DIAGNOSIS — E282 Polycystic ovarian syndrome: Secondary | ICD-10-CM | POA: Diagnosis not present

## 2019-11-21 DIAGNOSIS — F430 Acute stress reaction: Secondary | ICD-10-CM

## 2019-11-21 DIAGNOSIS — N970 Female infertility associated with anovulation: Secondary | ICD-10-CM

## 2019-11-21 MED ORDER — NIFEDIPINE ER OSMOTIC RELEASE 60 MG PO TB24: 60.0000 mg | ORAL_TABLET | Freq: Every day | ORAL | 3 refills | Status: DC

## 2019-11-21 NOTE — Progress Notes (Signed)
Subjective  CC:  Chief Complaint  Patient presents with  . Hypertension    116/67 at home, only checked twice since last visit    HPI: Angelica Harris is a 34 y.o. female who presents to the office today to address the problems listed above in the chief complaint.  Hypertension f/u: Control is good . Pt reports she is doing well. taking medications as instructed, no medication side effects noted, no TIAs, no chest pain on exertion, no dyspnea on exertion, no swelling of ankles. However she is now taking 60mg  procardia again because she didn't feel well on the 120mg . bp has been variable but controlled. Lowest 113/67. No cp or sob.  She denies adverse effects from his BP medications. Compliance with medication is good.   Postcoital spotting: reviewed sonohysterogram showing PCOS but normal endometrium w/o mass or polyp. Nl pelvic exam. Pt reports that with last intercourse, she did not have spotting. She is low risk for STDs and screens were not done. No vaginal pain, discharge.  Infertility: now ready to start trying to get pregnant again. On metformin.   Reports increase stress, irritability and some sadness. No h/o mood problems. Has things she'd like to talk about - would like to see a therapist. No panic sxs, SI, anhedonia.  Assessment  1. Essential hypertension   2. PCOS (polycystic ovarian syndrome)   3. Infertility associated with anovulation   4. Postcoital bleeding   5. Stress reaction      Plan    Hypertension f/u: BP control is well controlled. Continue procardia 60  Postcoital spotting f/u: unclear source. Reported happened even with masterbation. Nl work up to date.   PCOS on metformin to help with cycle and ovulation.  Family planning; if no luck, to [facility] help.   Stress reaction: rec therapy. Pt to schedule. Education regarding management of these chronic disease states was given. Management strategies discussed on successive visits include  dietary and exercise recommendations, goals of achieving and maintaining IBW, and lifestyle modifications aiming for adequate sleep and minimizing stressors.   Follow up: Return in about 6 months (around 05/23/2020) for complete physical, follow up Hypertension.  No orders of the defined types were placed in this encounter.  Meds ordered this encounter  Medications  . NIFEdipine (PROCARDIA XL/NIFEDICAL XL) 60 MG 24 hr tablet    Sig: Take 1 tablet (60 mg total) by mouth daily.    Dispense:  180 tablet    Refill:  3      BP Readings from Last 3 Encounters:  11/21/19 114/80  10/09/19 (!) 150/108  09/17/19 (!) 148/76   Wt Readings from Last 3 Encounters:  11/21/19 164 lb 3.2 oz (74.5 kg)  10/09/19 162 lb 6.4 oz (73.7 kg)  09/17/19 162 lb (73.5 kg)    Lab Results  Component Value Date   CHOL 174 03/23/2019   Lab Results  Component Value Date   HDL 40.30 03/23/2019   No results found for: [facility] Lab Results  Component Value Date   TRIG 365.0 (H) 03/23/2019   Lab Results  Component Value Date   CHOLHDL 4 03/23/2019   Lab Results  Component Value Date   LDLDIRECT 115.0 03/23/2019   Lab Results  Component Value Date   CREATININE 0.65 03/23/2019   BUN 8 03/23/2019   NA 139 03/23/2019   K 3.9 03/23/2019   CL 100 03/23/2019   CO2 27 03/23/2019    The ASCVD Risk score (Goff DC Jr.,  et al., 2013) failed to calculate for the following reasons:   The 2013 ASCVD risk score is only valid for ages 38 to 37  I reviewed the patients updated PMH, FH, and SocHx.    Patient Active Problem List   Diagnosis Date Noted  . Subclinical hypothyroidism 07/17/2018    Priority: High  . Essential hypertension 07/17/2018    Priority: High  . Infertility associated with anovulation 07/17/2018    Priority: Medium  . Cholelithiases - asymptomatic 07/17/2018    Priority: Medium  . PCOS (polycystic ovarian syndrome)     Priority: Medium    Allergies: Labetalol hcl  Social  History: Patient  reports that she has never smoked. She has never used smokeless tobacco. She reports that she does not drink alcohol or use drugs.  Current Meds  Medication Sig  . cholecalciferol (VITAMIN D3) 25 MCG (1000 UT) tablet Take 1,000 Units by mouth daily.  . ferrous sulfate 325 (65 FE) MG EC tablet Take 325 mg by mouth daily with breakfast.  . NIFEdipine (PROCARDIA XL/NIFEDICAL XL) 60 MG 24 hr tablet Take 1 tablet (60 mg total) by mouth daily.  . [DISCONTINUED] NIFEdipine (PROCARDIA XL/NIFEDICAL XL) 60 MG 24 hr tablet Take 2 tablets (120 mg total) by mouth daily.    Review of Systems: Cardiovascular: negative for chest pain, palpitations, leg swelling, orthopnea Respiratory: negative for SOB, wheezing or persistent cough Gastrointestinal: negative for abdominal pain Genitourinary: negative for dysuria or gross hematuria  Objective  Vitals: BP 114/80   Pulse (!) 108   Temp 98.8 F (37.1 C) (Temporal)   Resp 15   Ht 5\' 3"  (1.6 m)   Wt 164 lb 3.2 oz (74.5 kg)   SpO2 98%   BMI 29.09 kg/m  General: no acute distress  Psych:  Alert and oriented, normal mood and affect Neurologic:   Mental status is normal  Commons side effects, risks, benefits, and alternatives for medications and treatment plan prescribed today were discussed, and the patient expressed understanding of the given instructions. Patient is instructed to call or message via MyChart if he/she has any questions or concerns regarding our treatment plan. No barriers to understanding were identified. We discussed Red Flag symptoms and signs in detail. Patient expressed understanding regarding what to do in case of urgent or emergency type symptoms.   Medication list was reconciled, printed and provided to the patient in AVS. Patient instructions and summary information was reviewed with the patient as documented in the AVS. This note was prepared with assistance of Dragon voice recognition software. Occasional  wrong-word or sound-a-like substitutions may have occurred due to the inherent limitations of voice recognition software  This visit occurred during the SARS-CoV-2 public health emergency.  Safety protocols were in place, including screening questions prior to the visit, additional usage of staff PPE, and extensive cleaning of exam room while observing appropriate contact time as indicated for disinfecting solutions.

## 2019-11-21 NOTE — Patient Instructions (Signed)
Please return in 6 months for hypertension follow up and for your annual complete physical; please come fasting.  If you have any questions or concerns, please don't hesitate to send me a message via MyChart or call the office at [phone]. Thank you for visiting with [place] today! It's our pleasure caring for you.  Please call [facility] to schedule an appointment with Dr. Colen Darling; she is a therapist here at our [place] office.  The phone number is: [phone]

## 2019-12-03 ENCOUNTER — Other Ambulatory Visit: Payer: Self-pay

## 2019-12-03 ENCOUNTER — Encounter: Payer: Self-pay | Admitting: Family Medicine

## 2019-12-03 DIAGNOSIS — I10 Essential (primary) hypertension: Secondary | ICD-10-CM

## 2019-12-03 MED ORDER — NIFEDIPINE ER OSMOTIC RELEASE 60 MG PO TB24: 60.0000 mg | ORAL_TABLET | Freq: Every day | ORAL | 3 refills | Status: DC

## 2019-12-07 ENCOUNTER — Encounter: Payer: Self-pay | Admitting: Family Medicine

## 2019-12-24 ENCOUNTER — Encounter: Payer: Self-pay | Admitting: Family Medicine

## 2020-03-19 ENCOUNTER — Encounter: Payer: BC Managed Care – PPO | Admitting: Family Medicine

## 2020-03-25 ENCOUNTER — Encounter: Payer: BC Managed Care – PPO | Admitting: Family Medicine

## 2020-03-25 DIAGNOSIS — Z0289 Encounter for other administrative examinations: Secondary | ICD-10-CM

## 2020-04-21 ENCOUNTER — Other Ambulatory Visit: Payer: Self-pay | Admitting: Family Medicine

## 2020-04-23 DIAGNOSIS — Z0289 Encounter for other administrative examinations: Secondary | ICD-10-CM | POA: Diagnosis not present

## 2020-04-23 NOTE — Telephone Encounter (Signed)
Has f/u appt in November. Refilled #90.

## 2020-05-26 ENCOUNTER — Encounter: Payer: BC Managed Care – PPO | Admitting: Family Medicine

## 2020-06-02 ENCOUNTER — Other Ambulatory Visit (HOSPITAL_COMMUNITY)
Admission: RE | Admit: 2020-06-02 | Discharge: 2020-06-02 | Disposition: A | Discharge status: Home / Self Care | Payer: BC Managed Care – PPO | Source: Ambulatory Visit | Attending: Family Medicine | Admitting: Family Medicine

## 2020-06-02 ENCOUNTER — Ambulatory Visit (INDEPENDENT_AMBULATORY_CARE_PROVIDER_SITE_OTHER): Payer: BC Managed Care – PPO | Admitting: Family Medicine

## 2020-06-02 ENCOUNTER — Other Ambulatory Visit: Payer: Self-pay

## 2020-06-02 ENCOUNTER — Encounter: Payer: Self-pay | Admitting: Family Medicine

## 2020-06-02 ENCOUNTER — Telehealth: Payer: Self-pay

## 2020-06-02 VITALS — BP 150/88 | HR 76 | Temp 98.3°F | Ht 63.0 in | Wt 143.6 lb

## 2020-06-02 DIAGNOSIS — N760 Acute vaginitis: Secondary | ICD-10-CM | POA: Insufficient documentation

## 2020-06-02 DIAGNOSIS — Z124 Encounter for screening for malignant neoplasm of cervix: Secondary | ICD-10-CM | POA: Insufficient documentation

## 2020-06-02 DIAGNOSIS — E038 Other specified hypothyroidism: Secondary | ICD-10-CM | POA: Diagnosis not present

## 2020-06-02 DIAGNOSIS — I10 Essential (primary) hypertension: Secondary | ICD-10-CM | POA: Diagnosis not present

## 2020-06-02 DIAGNOSIS — E282 Polycystic ovarian syndrome: Secondary | ICD-10-CM | POA: Diagnosis not present

## 2020-06-02 DIAGNOSIS — Z1159 Encounter for screening for other viral diseases: Secondary | ICD-10-CM

## 2020-06-02 DIAGNOSIS — K8020 Calculus of gallbladder without cholecystitis without obstruction: Secondary | ICD-10-CM | POA: Diagnosis not present

## 2020-06-02 DIAGNOSIS — B9689 Other specified bacterial agents as the cause of diseases classified elsewhere: Secondary | ICD-10-CM | POA: Diagnosis not present

## 2020-06-02 DIAGNOSIS — Z0000 Encounter for general adult medical examination without abnormal findings: Secondary | ICD-10-CM

## 2020-06-02 DIAGNOSIS — N898 Other specified noninflammatory disorders of vagina: Secondary | ICD-10-CM

## 2020-06-02 DIAGNOSIS — Z113 Encounter for screening for infections with a predominantly sexual mode of transmission: Secondary | ICD-10-CM | POA: Insufficient documentation

## 2020-06-02 LAB — CBC WITH DIFFERENTIAL/PLATELET
Basophils Absolute: 30 cells/uL (ref 0–200)
Eosinophils Absolute: 113 cells/uL (ref 15–500)
HCT: 37.5 % (ref 35.0–45.0)
MCH: 29.9 pg (ref 27.0–33.0)
Neutro Abs: 5280 cells/uL (ref 1500–7800)

## 2020-06-02 NOTE — Patient Instructions (Signed)
Please return in 6 months for hypertension follow up.  I will release your lab results to you on your MyChart account with further instructions. Please reply with any questions.   Please purchase ovulation kits and start using them to know if you are ovulating and when is the best time to try to get pregnant.  Please send me some blood pressure readings.   If you have any questions or concerns, please don't hesitate to send me a message via MyChart or call the office at [phone]. Thank you for visiting with [place] today! It's our pleasure caring for you.    Please do these things to maintain good health!   Exercise at least 30-45 minutes a day,  4-5 days a week.   Eat a low-fat diet with lots of fruits and vegetables, up to 7-9 servings per day.  Drink plenty of water daily. Try to drink 8 8oz glasses per day.  Seatbelts can save your life. Always wear your seatbelt.  Place Smoke Detectors on every level of your home and check batteries every year.  Schedule an appointment with an eye doctor for an eye exam every 1-2 years  Safe sex - use condoms to protect yourself from STDs if you could be exposed to these types of infections. Use birth control if you do not want to become pregnant and are sexually active.  Avoid heavy alcohol use. If you drink, keep it to less than 2 drinks/day and not every day.  Health Care Power of Attorney.  Choose someone you trust that could speak for you if you became unable to speak for yourself.  Depression is common in our stressful world.If you're feeling down or losing interest in things you normally enjoy, please come in for a visit.  If anyone is threatening or hurting you, please get help. Physical or Emotional Violence is never OK.

## 2020-06-02 NOTE — Progress Notes (Signed)
Subjective  Chief Complaint  Patient presents with  . Annual Exam    fasting, down 20 lbs since last visit  . Gynecologic Exam    has experienced some burning and itching over the past several weeks    HPI: Angelica Harris is a 34 y.o. female who presents to [facility] today for a Female Wellness Visit. She also has the concerns and/or needs as listed above in the chief complaint. These will be addressed in addition to the Health Maintenance Visit.   Wellness Visit: annual visit with health maintenance review and exam with Pap   Health maintenance: 34 year old G2 P1011 here with her husband.  They are trying to get pregnant.  They report they have been serious for about a month now.  Has history of infertility due to PCOS anovulation.  Currently on Metformin and having regular menstrual cycles.  Has not checked ovulation kits.  She is exercising daily and has lost 20 pounds purposefully but also due to decreased appetite when she was not feeling as well.  See below.   Chronic disease f/u and/or acute problem visit: (deemed necessary to be done in addition to the wellness visit):  Hypertension: On Procardia 60 mg.  She feels her blood pressures have been controlled but has not checked them at home.  Has not had blood pressure medication today.  Blood pressure is elevated.  No chest pain, shortness of breath, palpitations or lower extremity edema.  No adverse effects from medications.  PCOS on Metformin with history of anovulation: Reports regular menstrual cycles.  No intermenstrual bleeding.  Does complain of vaginal itching over the last week or 2.  Some white thin discharge present as well.  No odor.  No risk of STDs.  History of cholelithiasis: Denies right upper quadrant pain.  History of mildly elevated LFTs.  Subclinical hypothyroidism on low-dose supplement taking daily.  No symptoms of low or high thyroid.  Mood: Much improved.  Never  needed counseling.  She and her husband are able to talk through things and she is much happier.  At 1st had a decreased appetite due to low mood.  Had weight loss.  However now eating better.  No symptoms of depression at this time.  Assessment  1. Annual physical exam   2. Essential hypertension   3. PCOS (polycystic ovarian syndrome)   4. Calculus of gallbladder without cholecystitis without obstruction   5. Need for hepatitis C screening test   6. Subclinical hypothyroidism   7. Cervical cancer screening   8. Vaginal itching      Plan  Female Wellness Visit:  Age appropriate Health Maintenance and Prevention measures were discussed with patient. Included topics are cancer screening recommendations, ways to keep healthy (see AVS) including dietary and exercise recommendations, regular eye and dental care, use of seat belts, and avoidance of moderate alcohol use and tobacco use.  Pap smear done today.  Due for Tdap.  Will need to get this in the next year hopefully when she is pregnant.  BMI: discussed patient's BMI and encouraged positive lifestyle modifications to help get to or maintain a target BMI.  HM needs and immunizations were addressed and ordered. See below for orders. See HM and immunization section for updates.  Routine labs and screening tests ordered including cmp, cbc and lipids where appropriate.  Discussed recommendations regarding Vit D and calcium supplementation (see AVS)  Chronic disease management visit and/or acute problem visit:  Hypertension: Elevated  readings today.  In part due to not taking medications but am concerned blood pressure may not be well controlled.  She will start home monitoring and send me numbers.  Will titrate medications up if needed at that time.  Check renal function and electrolytes.  Check thyroid function on low-dose supplements.  Clinically euthyroid.  Would like TSH to be less than 2 as she is trying to get pregnant  Asymptomatic  cholelithiasis: Monitor  Family planning: Start ovulation kits.  Would give this about 3 months.  Continue multivitamin.  To see OB if not we will get pregnant on their own.  Continue Metformin for PCOS  Check for vaginitis given vaginal itching.  Await testing.   Follow up: Return in about 6 months (around 12/01/2020) for follow up Hypertension.  Orders Placed This Encounter  Procedures  . CBC with Differential/Platelet  . COMPLETE METABOLIC PANEL WITH GFR  . Lipid panel  . TSH  . Hepatitis C antibody  . T4, free  . T3   No orders of the defined types were placed in this encounter.     Lifestyle: Body mass index is 25.44 kg/m. Wt Readings from Last 3 Encounters:  06/02/20 143 lb 9.6 oz (65.1 kg)  11/21/19 164 lb 3.2 oz (74.5 kg)  10/09/19 162 lb 6.4 oz (73.7 kg)     Patient Active Problem List   Diagnosis Date Noted  . Subclinical hypothyroidism 07/17/2018    Priority: High  . Essential hypertension 07/17/2018    Priority: High  . Infertility associated with anovulation 07/17/2018    Priority: Medium  . Cholelithiases - asymptomatic 07/17/2018    Priority: Medium    By ultrasound   . PCOS (polycystic ovarian syndrome)     Priority: Medium   Health Maintenance  Topic Date Due  . Hepatitis C Screening  Never done  . PAP SMEAR-Modifier  07/17/2020  . TETANUS/TDAP  02/23/2023  . INFLUENZA VACCINE  Completed  . COVID-19 Vaccine  Completed  . HIV Screening  Completed   Immunization History  Administered Date(s) Administered  . Influenza,inj,Quad PF,6+ Mos 03/23/2019  . Influenza-Unspecified 05/03/2020  . Moderna SARS-COVID-2 Vaccination 09/13/2019, 10/11/2019   We updated and reviewed the patient's past history in detail and it is documented below. Allergies: Patient is allergic to labetalol hcl. Past Medical History Patient  has a past medical history of Cervical incompetence, Cholelithiases - asymptomatic (07/17/2018), Essential hypertension (07/17/2018),  Hypertension, Infertility associated with anovulation, PCOS (polycystic ovarian syndrome), Subclinical hypothyroidism (07/17/2018), and Thyroid disease. Past Surgical History Patient  has a past surgical history that includes Cervical cerclage and Cesarean section. Family History: Patient family history includes Diabetes in her father; Healthy in her daughter; Hypertension in her mother; Thyroid disease in her father. Social History:  Patient  reports that she has never smoked. She has never used smokeless tobacco. She reports that she does not drink alcohol and does not use drugs.  Review of Systems: Constitutional: negative for fever or malaise Ophthalmic: negative for photophobia, double vision or loss of vision Cardiovascular: negative for chest pain, dyspnea on exertion, or new LE swelling Respiratory: negative for SOB or persistent cough Gastrointestinal: negative for abdominal pain, change in bowel habits or melena Genitourinary: negative for dysuria or gross hematuria, no abnormal uterine bleeding or disharge Musculoskeletal: negative for new gait disturbance or muscular weakness Integumentary: negative for new or persistent rashes, no breast lumps Neurological: negative for TIA or stroke symptoms Psychiatric: negative for SI or delusions Allergic/Immunologic: negative for  hives  Patient Care Team    Relationship Specialty Notifications Start End  Willow Ora, MD PCP - General Family Medicine  07/17/18     Objective  Vitals: BP (!) 150/88   Pulse 76   Temp 98.3 F (36.8 C) (Temporal)   Ht 5\' 3"  (1.6 m)   Wt 143 lb 9.6 oz (65.1 kg)   LMP 05/19/2020 (Exact Date)   SpO2 98%   BMI 25.44 kg/m  General:  Well developed, well nourished, no acute distress  Psych:  Alert and orientedx3,normal mood and affect HEENT:  Normocephalic, atraumatic, non-icteric sclera,  supple neck without adenopathy, mass or thyromegaly Cardiovascular:  Normal S1, S2, RRR without gallop, rub or  murmur Respiratory:  Good breath sounds bilaterally, CTAB with normal respiratory effort Gastrointestinal: normal bowel sounds, soft, non-tender, no noted masses. No HSM MSK: no deformities, contusions. Joints are without erythema or swelling.  Skin:  Warm, no rashes or suspicious lesions noted, hirsutism present Neurologic:    Mental status is normal. CN 2-11 are normal. Gross motor and sensory exams are normal. Normal gait. No tremor Breast Exam: No mass, skin retraction or nipple discharge is appreciated in either breast. No axillary adenopathy. Fibrocystic changes are not noted Pelvic Exam: Normal external genitalia, no vulvar or vaginal lesions present. Clear cervix w/o CMT. thin discharge present bimanual exam reveals a nontender fundus w/o masses, nl size. No adnexal masses present. No inguinal adenopathy. A PAP smear was performed.    Commons side effects, risks, benefits, and alternatives for medications and treatment plan prescribed today were discussed, and the patient expressed understanding of the given instructions. Patient is instructed to call or message via MyChart if he/she has any questions or concerns regarding our treatment plan. No barriers to understanding were identified. We discussed Red Flag symptoms and signs in detail. Patient expressed understanding regarding what to do in case of urgent or emergency type symptoms.   Medication list was reconciled, printed and provided to the patient in AVS. Patient instructions and summary information was reviewed with the patient as documented in the AVS. This note was prepared with assistance of Dragon voice recognition software. Occasional wrong-word or sound-a-like substitutions may have occurred due to the inherent limitations of voice recognition software  This visit occurred during the SARS-CoV-2 public health emergency.  Safety protocols were in place, including screening questions prior to the visit, additional usage of staff  PPE, and extensive cleaning of exam room while observing appropriate contact time as indicated for disinfecting solutions.

## 2020-06-02 NOTE — Addendum Note (Signed)
Addended by: Daryll Brod on: 06/02/2020 09:43 AM   Modules accepted: Orders

## 2020-06-02 NOTE — Telephone Encounter (Signed)
Patient would like to see if the no show charge taken off the appt for 03/25/20.

## 2020-06-03 ENCOUNTER — Other Ambulatory Visit: Payer: Self-pay

## 2020-06-03 LAB — T4, FREE: Free T4: 1.2 ng/dL (ref 0.8–1.8)

## 2020-06-03 LAB — CERVICOVAGINAL ANCILLARY ONLY
Bacterial Vaginitis (gardnerella): POSITIVE — AB
Candida Glabrata: NEGATIVE
Candida Vaginitis: NEGATIVE
Chlamydia: NEGATIVE
Comment: NEGATIVE
Comment: NEGATIVE
Comment: NEGATIVE
Comment: NEGATIVE
Comment: NEGATIVE
Comment: NORMAL
Neisseria Gonorrhea: NEGATIVE
Trichomonas: NEGATIVE

## 2020-06-03 LAB — LIPID PANEL: Non-HDL Cholesterol (Calc): 123 mg/dL (calc) (ref <130)

## 2020-06-03 MED ORDER — METRONIDAZOLE 500 MG PO TABS: 500.0000 mg | ORAL_TABLET | Freq: Two times a day (BID) | ORAL | 0 refills | Status: DC

## 2020-06-03 NOTE — Progress Notes (Signed)
Please call patient: I have reviewed his/her lab results. Please verify that she was fasting: sugar was elevated at 116;  add a1c.  Other labs are all fine but she does have BV - a bacterial vaginal infection; order flagyl 500 bid x 7 days to treat. No other infections are present.

## 2020-06-04 LAB — CYTOLOGY - PAP
Comment: NEGATIVE
Diagnosis: NEGATIVE
High risk HPV: NEGATIVE

## 2020-06-04 NOTE — Telephone Encounter (Signed)
I have submitted for this to be voided.    I have informed patient.

## 2020-06-04 NOTE — Telephone Encounter (Signed)
I have spoken with patient in regard to lab results.  I have read her Dr. Modesta Messing response and patient understood.  Patient did state that she ate a small piece of break before her labs.    Please follow back up with patient in regard to if she needs to come back in for a repeat A1C.

## 2020-06-05 LAB — CBC WITH DIFFERENTIAL/PLATELET
Absolute Monocytes: 390 cells/uL (ref 200–950)
Basophils Relative: 0.4 %
Eosinophils Relative: 1.5 %
Hemoglobin: 12.6 g/dL (ref 11.7–15.5)
Lymphs Abs: 1688 cells/uL (ref 850–3900)
MCHC: 33.6 g/dL (ref 32.0–36.0)
MCV: 88.9 fL (ref 80.0–100.0)
MPV: 10.9 fL (ref 7.5–12.5)
Monocytes Relative: 5.2 %
Neutrophils Relative %: 70.4 %
Platelets: 330 10*3/uL (ref 140–400)
RBC: 4.22 10*6/uL (ref 3.80–5.10)
RDW: 12.6 % (ref 11.0–15.0)
Total Lymphocyte: 22.5 %
WBC: 7.5 10*3/uL (ref 3.8–10.8)

## 2020-06-05 LAB — LIPID PANEL
Cholesterol: 169 mg/dL (ref <200)
HDL: 46 mg/dL — ABNORMAL LOW (ref >=50)
LDL Cholesterol (Calc): 100 mg/dL (calc) — ABNORMAL HIGH
Total CHOL/HDL Ratio: 3.7 (calc) (ref <5.0)
Triglycerides: 130 mg/dL (ref <150)

## 2020-06-05 LAB — HEMOGLOBIN A1C W/OUT EAG: Hgb A1c MFr Bld: 6.6 % of total Hgb — ABNORMAL HIGH (ref <5.7)

## 2020-06-05 LAB — COMPLETE METABOLIC PANEL WITH GFR
AG Ratio: 1.6 (calc) (ref 1.0–2.5)
ALT: 21 U/L (ref 6–29)
AST: 14 U/L (ref 10–30)
Albumin: 4.1 g/dL (ref 3.6–5.1)
Alkaline phosphatase (APISO): 42 U/L (ref 31–125)
BUN: 10 mg/dL (ref 7–25)
CO2: 26 mmol/L (ref 20–32)
Calcium: 9.1 mg/dL (ref 8.6–10.2)
Chloride: 102 mmol/L (ref 98–110)
Creat: 0.64 mg/dL (ref 0.50–1.10)
GFR, Est African American: 133 mL/min/{1.73_m2} (ref >=60)
GFR, Est Non African American: 115 mL/min/{1.73_m2} (ref >=60)
Globulin: 2.5 g/dL (calc) (ref 1.9–3.7)
Glucose, Bld: 116 mg/dL — ABNORMAL HIGH (ref 65–99)
Potassium: 4.2 mmol/L (ref 3.5–5.3)
Sodium: 138 mmol/L (ref 135–146)
Total Bilirubin: 0.3 mg/dL (ref 0.2–1.2)
Total Protein: 6.6 g/dL (ref 6.1–8.1)

## 2020-06-05 LAB — TSH: TSH: 2.49 mIU/L

## 2020-06-05 LAB — HEPATITIS C ANTIBODY
Hepatitis C Ab: NONREACTIVE
SIGNAL TO CUT-OFF: 0.01 (ref <1.00)

## 2020-06-05 LAB — T3: T3, Total: 101 ng/dL (ref 76–181)

## 2020-06-06 NOTE — Telephone Encounter (Signed)
Noted; nonfasting glucose of 116.

## 2020-06-12 ENCOUNTER — Other Ambulatory Visit: Payer: Self-pay | Admitting: Family Medicine

## 2020-06-23 ENCOUNTER — Other Ambulatory Visit: Payer: Self-pay

## 2020-06-23 MED ORDER — METFORMIN HCL 500 MG PO TABS
500.0000 mg | ORAL_TABLET | Freq: Two times a day (BID) | ORAL | 3 refills | Status: DC
Start: 2020-06-23 — End: 2021-07-02

## 2020-07-04 ENCOUNTER — Other Ambulatory Visit: Payer: Self-pay | Admitting: Family Medicine

## 2020-08-25 ENCOUNTER — Encounter: Payer: Self-pay | Admitting: Family Medicine

## 2020-08-25 ENCOUNTER — Ambulatory Visit: Payer: BC Managed Care – PPO | Admitting: Family Medicine

## 2020-08-25 ENCOUNTER — Other Ambulatory Visit: Payer: Self-pay

## 2020-08-25 VITALS — BP 134/82 | HR 102 | Temp 98.2°F | Wt 137.4 lb

## 2020-08-25 DIAGNOSIS — I10 Essential (primary) hypertension: Secondary | ICD-10-CM | POA: Diagnosis not present

## 2020-08-25 DIAGNOSIS — M5432 Sciatica, left side: Secondary | ICD-10-CM | POA: Diagnosis not present

## 2020-08-25 DIAGNOSIS — E282 Polycystic ovarian syndrome: Secondary | ICD-10-CM

## 2020-08-25 DIAGNOSIS — R7303 Prediabetes: Secondary | ICD-10-CM

## 2020-08-25 DIAGNOSIS — I83813 Varicose veins of bilateral lower extremities with pain: Secondary | ICD-10-CM | POA: Insufficient documentation

## 2020-08-25 LAB — POCT GLYCOSYLATED HEMOGLOBIN (HGB A1C): Hemoglobin A1C: 6 % — AB (ref 4.0–5.6)

## 2020-08-25 MED ORDER — DICLOFENAC SODIUM 75 MG PO TBEC: 75.0000 mg | DELAYED_RELEASE_TABLET | Freq: Two times a day (BID) | ORAL | 0 refills | Status: DC

## 2020-08-25 NOTE — Patient Instructions (Addendum)
Please follow up as scheduled for your next visit with me: 12/01/2020   If you have any questions or concerns, please don't hesitate to send me a message via MyChart or call the office at [phone]. Thank you for visiting with Angelica Harris today! It's our pleasure caring for you.  Your weight is 6 pounds down from last visit.  I suspect you are eating healthier.   Wt Readings from Last 3 Encounters:  08/25/20 137 lb 6.4 oz (62.3 kg)  06/02/20 143 lb 9.6 oz (65.1 kg)  11/21/19 164 lb 3.2 oz (74.5 kg)    Sciatica  Sciatica is pain, numbness, weakness, or tingling along the path of the sciatic nerve. The sciatic nerve starts in the lower back and runs down the back of each leg. The nerve controls the muscles in the lower leg and in the back of the knee. It also provides feeling (sensation) to the back of the thigh, the lower leg, and the sole of the foot. Sciatica is a symptom of another medical condition that pinches or puts pressure on the sciatic nerve. Sciatica most often only affects one side of the body. Sciatica usually goes away on its own or with treatment. In some cases, sciatica may come back (recur). What are the causes? This condition is caused by pressure on the sciatic nerve or pinching of the nerve. This may be the result of:  A disk in between the bones of the spine bulging out too far (herniated disk).  Age-related changes in the spinal disks.  A pain disorder that affects a muscle in the buttock.  Extra bone growth near the sciatic nerve.  A break (fracture) of the pelvis.  Pregnancy.  Tumor. This is rare. What increases the risk? The following factors may make you more likely to develop this condition:  Playing sports that place pressure or stress on the spine.  Having poor strength and flexibility.  A history of back injury or surgery.  Sitting for long periods of time.  Doing activities that involve repetitive bending or lifting.  Obesity. What are the signs  or symptoms? Symptoms can vary from mild to very severe, and they may include:  Any of these problems in the lower back, leg, hip, or buttock: ? Mild tingling, numbness, or dull aches. ? Burning sensations. ? Sharp pains.  Numbness in the back of the calf or the sole of the foot.  Leg weakness.  Severe back pain that makes movement difficult. Symptoms may get worse when you cough, sneeze, or laugh, or when you sit or stand for long periods of time. How is this diagnosed? This condition may be diagnosed based on:  Your symptoms and medical history.  A physical exam.  Blood tests.  Imaging tests, such as: ? X-rays. ? MRI. ? CT scan. How is this treated? In many cases, this condition improves on its own without treatment. However, treatment may include:  Reducing or modifying physical activity.  Exercising and stretching.  Icing and applying heat to the affected area.  Medicines that help to: ? Relieve pain and swelling. ? Relax your muscles.  Injections of medicines that help to relieve pain, irritation, and inflammation around the sciatic nerve (steroids).  Surgery. Follow these instructions at home: Medicines  Take over-the-counter and prescription medicines only as told by your health care provider.  Ask your health care provider if the medicine prescribed to you: ? Requires you to avoid driving or using heavy machinery. ? Can cause constipation. You  may need to take these actions to prevent or treat constipation:  Drink enough fluid to keep your urine pale yellow.  Take over-the-counter or prescription medicines.  Eat foods that are high in fiber, such as beans, whole grains, and fresh fruits and vegetables.  Limit foods that are high in fat and processed sugars, such as fried or sweet foods. Managing pain  If directed, put ice on the affected area. ? Put ice in a plastic bag. ? Place a towel between your skin and the bag. ? Leave the ice on for 20  minutes, 2-3 times a day.  If directed, apply heat to the affected area. Use the heat source that your health care provider recommends, such as a moist heat pack or a heating pad. ? Place a towel between your skin and the heat source. ? Leave the heat on for 20-30 minutes. ? Remove the heat if your skin turns bright red. This is especially important if you are unable to feel pain, heat, or cold. You may have a greater risk of getting burned.      Activity  Return to your normal activities as told by your health care provider. Ask your health care provider what activities are safe for you.  Avoid activities that make your symptoms worse.  Take brief periods of rest throughout the day. ? When you rest for longer periods, mix in some mild activity or stretching between periods of rest. This will help to prevent stiffness and pain. ? Avoid sitting for long periods of time without moving. Get up and move around at least one time each hour.  Exercise and stretch regularly, as told by your health care provider.  Do not lift anything that is heavier than 10 lb (4.5 kg) while you have symptoms of sciatica. When you do not have symptoms, you should still avoid heavy lifting, especially repetitive heavy lifting.  When you lift objects, always use proper lifting technique, which includes: ? Bending your knees. ? Keeping the load close to your body. ? Avoiding twisting.   General instructions  Maintain a healthy weight. Excess weight puts extra stress on your back.  Wear supportive, comfortable shoes. Avoid wearing high heels.  Avoid sleeping on a mattress that is too soft or too hard. A mattress that is firm enough to support your back when you sleep may help to reduce your pain.  Keep all follow-up visits as told by your health care provider. This is important. Contact a health care provider if:  You have pain that: ? Wakes you up when you are sleeping. ? Gets worse when you lie  down. ? Is worse than you have experienced in the past. ? Lasts longer than 4 weeks.  You have an unexplained weight loss. Get help right away if:  You are not able to control when you urinate or have bowel movements (incontinence).  You have: ? Weakness in your lower back, pelvis, buttocks, or legs that gets worse. ? Redness or swelling of your back. ? A burning sensation when you urinate. Summary  Sciatica is pain, numbness, weakness, or tingling along the path of the sciatic nerve.  This condition is caused by pressure on the sciatic nerve or pinching of the nerve.  Sciatica can cause pain, numbness, or tingling in the lower back, legs, hips, and buttocks.  Treatment often includes rest, exercise, medicines, and applying ice or heat. This information is not intended to replace advice given to you by your health care  provider. Make sure you discuss any questions you have with your health care provider. Document Revised: 07/03/2018 Document Reviewed: 07/03/2018 Elsevier Patient Education  2021 [employer].

## 2020-08-25 NOTE — Progress Notes (Signed)
Subjective  CC:  Chief Complaint  Patient presents with  . Leg Pain    Previously dx with varicose veins     HPI: Angelica Harris is a 35 y.o. female who presents to the office today to address the problems listed above in the chief complaint.  C/o 2 week h/o left buttock and leg pain: wasn't sure if it was due to her varicose veins. Admits to improvement after advil and over time. No leg weakness or b/b dysfunction. No injury. She rested from her daily walking program with some improvement. No redness or warmth to leg.   Reviewed a1c. - In December a1c was high. Since eating better and has lost some weight. On metformin for pcos and fertility issues. Denies sxs of hyperglycemia. Lab Results  Component Value Date   HGBA1C 6.0 (A) 08/25/2020   HGBA1C 6.6 (H) 06/02/2020      Wt Readings from Last 3 Encounters:  08/25/20 137 lb 6.4 oz (62.3 kg)  06/02/20 143 lb 9.6 oz (65.1 kg)  11/21/19 164 lb 3.2 oz (74.5 kg)    Assessment  1. Sciatica, left side   2. Prediabetes   3. PCOS (polycystic ovarian syndrome)   4. Essential hypertension   5. Varicose veins of both lower extremities with pain      Plan   Sciatica, left:  Improving. Educarted. Start stretches and 2 week course of nsaids.   Prediabetes and pcos: ;trying to get pregnant. Improved. Continue to eat diabetic diet.   HTN is controlled.   Varicose veins: not currently inflamed. At times tender. Will monitor. May warrant referral in the future.   Follow up: as scheduled.   12/01/2020  Orders Placed This Encounter  Procedures  . POCT glycosylated hemoglobin (Hb A1C)   Meds ordered this encounter  Medications  . diclofenac (VOLTAREN) 75 MG EC tablet    Sig: Take 1 tablet (75 mg total) by mouth 2 (two) times daily.    Dispense:  30 tablet    Refill:  0      I reviewed the patients updated PMH, FH, and SocHx.    Patient Active Problem List   Diagnosis Date Noted  . Subclinical hypothyroidism  07/17/2018    Priority: High  . Essential hypertension 07/17/2018    Priority: High  . Infertility associated with anovulation 07/17/2018    Priority: Medium  . Cholelithiases - asymptomatic 07/17/2018    Priority: Medium  . PCOS (polycystic ovarian syndrome)     Priority: Medium  . Varicose veins of both lower extremities with pain 08/25/2020   Current Meds  Medication Sig  . cholecalciferol (VITAMIN D3) 25 MCG (1000 UT) tablet Take 1,000 Units by mouth daily.  . diclofenac (VOLTAREN) 75 MG EC tablet Take 1 tablet (75 mg total) by mouth 2 (two) times daily.  . ferrous sulfate 325 (65 FE) MG EC tablet Take 325 mg by mouth daily with breakfast.  . levothyroxine (SYNTHROID) 25 MCG tablet TAKE 1 TABLET DAILY  . metFORMIN (GLUCOPHAGE) 500 MG tablet Take 1 tablet (500 mg total) by mouth 2 (two) times daily with a meal.  . NIFEdipine (PROCARDIA XL/NIFEDICAL XL) 60 MG 24 hr tablet Take 1 tablet (60 mg total) by mouth daily.  . [DISCONTINUED] metroNIDAZOLE (FLAGYL) 500 MG tablet Take 1 tablet (500 mg total) by mouth 2 (two) times daily.    Allergies: Patient is allergic to labetalol hcl. Family History: Patient family history includes Diabetes in her father; Healthy in her daughter;  Hypertension in her mother; Thyroid disease in her father. Social History:  Patient  reports that she has never smoked. She has never used smokeless tobacco. She reports that she does not drink alcohol and does not use drugs.  Review of Systems: Constitutional: Negative for fever malaise or anorexia Cardiovascular: negative for chest pain Respiratory: negative for SOB or persistent cough Gastrointestinal: negative for abdominal pain  Objective  Vitals: BP 134/82   Pulse (!) 102   Temp 98.2 F (36.8 C) (Temporal)   Wt 137 lb 6.4 oz (62.3 kg)   SpO2 99%   BMI 24.34 kg/m  General: no acute distress , A&Ox3 HEENT: PEERL, conjunctiva normal, neck is supple Cardiovascular:  RRR without murmur or gallop.   Respiratory:  Good breath sounds bilaterally, CTAB with normal respiratory effort Skin:  Warm, no rashes Left leg, nontender large varicosities present. Neg slr bilaterl. Left sciatic notch ttp.      Commons side effects, risks, benefits, and alternatives for medications and treatment plan prescribed today were discussed, and the patient expressed understanding of the given instructions. Patient is instructed to call or message via MyChart if he/she has any questions or concerns regarding our treatment plan. No barriers to understanding were identified. We discussed Red Flag symptoms and signs in detail. Patient expressed understanding regarding what to do in case of urgent or emergency type symptoms.   Medication list was reconciled, printed and provided to the patient in AVS. Patient instructions and summary information was reviewed with the patient as documented in the AVS. This note was prepared with assistance of Dragon voice recognition software. Occasional wrong-word or sound-a-like substitutions may have occurred due to the inherent limitations of voice recognition software  This visit occurred during the SARS-CoV-2 public health emergency.  Safety protocols were in place, including screening questions prior to the visit, additional usage of staff PPE, and extensive cleaning of exam room while observing appropriate contact time as indicated for disinfecting solutions.

## 2020-12-01 ENCOUNTER — Encounter: Payer: Self-pay | Admitting: Family Medicine

## 2020-12-01 ENCOUNTER — Other Ambulatory Visit: Payer: Self-pay

## 2020-12-01 ENCOUNTER — Ambulatory Visit: Payer: BC Managed Care – PPO | Admitting: Family Medicine

## 2020-12-01 VITALS — BP 134/82 | HR 88 | Temp 98.4°F | Ht 63.0 in | Wt 133.8 lb

## 2020-12-01 DIAGNOSIS — E038 Other specified hypothyroidism: Secondary | ICD-10-CM

## 2020-12-01 DIAGNOSIS — E1165 Type 2 diabetes mellitus with hyperglycemia: Secondary | ICD-10-CM | POA: Insufficient documentation

## 2020-12-01 DIAGNOSIS — N970 Female infertility associated with anovulation: Secondary | ICD-10-CM

## 2020-12-01 DIAGNOSIS — E282 Polycystic ovarian syndrome: Secondary | ICD-10-CM | POA: Diagnosis not present

## 2020-12-01 DIAGNOSIS — I10 Essential (primary) hypertension: Secondary | ICD-10-CM | POA: Diagnosis not present

## 2020-12-01 DIAGNOSIS — K8020 Calculus of gallbladder without cholecystitis without obstruction: Secondary | ICD-10-CM | POA: Diagnosis not present

## 2020-12-01 DIAGNOSIS — R7303 Prediabetes: Secondary | ICD-10-CM | POA: Diagnosis not present

## 2020-12-01 DIAGNOSIS — E119 Type 2 diabetes mellitus without complications: Secondary | ICD-10-CM | POA: Insufficient documentation

## 2020-12-01 LAB — POCT GLYCOSYLATED HEMOGLOBIN (HGB A1C): Hemoglobin A1C: 5.9 % — AB (ref 4.0–5.6)

## 2020-12-01 MED ORDER — NIFEDIPINE ER OSMOTIC RELEASE 60 MG PO TB24: 60.0000 mg | ORAL_TABLET | Freq: Every day | ORAL | 3 refills | Status: DC

## 2020-12-01 NOTE — Progress Notes (Signed)
Subjective  CC:   Chief Complaint  Patient presents with  . Hypertension    HPI: Angelica Harris is a 35 y.o. female who presents to the office today to address the problems listed above in the chief complaint.  Hypertension f/u: Control is good . Pt reports she is doing well. taking medications as instructed, no medication side effects noted, no TIAs, no chest pain on exertion, no dyspnea on exertion, no swelling of ankles. She denies adverse effects from his BP medications. Compliance with medication is good.   PCOS and prediabetes: She has eaten well and has decreased her quantity of food.  Her weight is down significantly since December.  She feels well.  She denies symptoms of hyperglycemia.  Continues to take metformin for cycle control.  She is having irregular cycles with some premenses spotting.  Continuing to try to get pregnant.  Used an ovulation kit for 1 month.  She and her husband have been working to get pregnant for about 6 months.  She denies biliary colic or gallbladder attacks.  She takes low-dose levothyroxine for subclinical hypothyroidism in the setting of trying to become pregnant.  Assessment  1. Essential hypertension   2. PCOS (polycystic ovarian syndrome)   3. Calculus of gallbladder without cholecystitis without obstruction   4. Prediabetes   5. Subclinical hypothyroidism   6. Infertility associated with anovulation      Plan    Hypertension f/u: BP control is well controlled. Continue Procardia XL 60 mg daily  Prediabetes and PCOS: sounds like anovulatory cycles. Reassured. rec ovulation kits and referral to OB for further eval for infertility. AMA and PCOS. Continue metformin. Continue healthy diet.   Subclinical hypothyroidism on low-dose levothyroxine, most recent TSH around 2.0.  We could consider increasing dose.  Will defer to OB.  Infertility associated with PCOS: Refer to OB for further evaluation and management strategies.  rec pnv  Remains asymptomatic cholelithiasis.  Education regarding management of these chronic disease states was given. Management strategies discussed on successive visits include dietary and exercise recommendations, goals of achieving and maintaining IBW, and lifestyle modifications aiming for adequate sleep and minimizing stressors.   Follow up: 6 mo for cpe  Orders Placed This Encounter  Procedures  . Ambulatory referral to Obstetrics / Gynecology  . POCT HgB A1C   No orders of the defined types were placed in this encounter.     BP Readings from Last 3 Encounters:  12/01/20 134/82  08/25/20 134/82  06/02/20 (!) 150/88   Wt Readings from Last 3 Encounters:  12/01/20 133 lb 12.8 oz (60.7 kg)  08/25/20 137 lb 6.4 oz (62.3 kg)  06/02/20 143 lb 9.6 oz (65.1 kg)    Lab Results  Component Value Date   CHOL 169 06/02/2020   CHOL 174 03/23/2019   Lab Results  Component Value Date   HDL 46 (L) 06/02/2020   HDL 40.30 03/23/2019   Lab Results  Component Value Date   LDLCALC 100 (H) 06/02/2020   Lab Results  Component Value Date   TRIG 130 06/02/2020   TRIG 365.0 (H) 03/23/2019   Lab Results  Component Value Date   CHOLHDL 3.7 06/02/2020   CHOLHDL 4 03/23/2019   Lab Results  Component Value Date   LDLDIRECT 115.0 03/23/2019   Lab Results  Component Value Date   CREATININE 0.64 06/02/2020   BUN 10 06/02/2020   NA 138 06/02/2020   K 4.2 06/02/2020   CL 102 06/02/2020  CO2 26 06/02/2020   Lab Results  Component Value Date   TSH 2.49 06/02/2020   Lab Results  Component Value Date   HGBA1C 5.9 (A) 12/01/2020   HGBA1C 6.0 (A) 08/25/2020   HGBA1C 6.6 (H) 06/02/2020       I reviewed the patients updated PMH, FH, and SocHx.    Patient Active Problem List   Diagnosis Date Noted  . Prediabetes 12/01/2020    Priority: High  . Subclinical hypothyroidism 07/17/2018    Priority: High  . Essential hypertension 07/17/2018    Priority: High  .  Infertility associated with anovulation 07/17/2018    Priority: Medium  . Cholelithiases - asymptomatic 07/17/2018    Priority: Medium  . PCOS (polycystic ovarian syndrome)     Priority: Medium  . Varicose veins of both lower extremities with pain 08/25/2020    Priority: Low    Allergies: Labetalol hcl  Social History: Patient  reports that she has never smoked. She has never used smokeless tobacco. She reports that she does not drink alcohol and does not use drugs.  Current Meds  Medication Sig  . cholecalciferol (VITAMIN D3) 25 MCG (1000 UT) tablet Take 1,000 Units by mouth daily.  . ferrous sulfate 325 (65 FE) MG EC tablet Take 325 mg by mouth daily with breakfast.  . levothyroxine (SYNTHROID) 25 MCG tablet TAKE 1 TABLET DAILY  . metFORMIN (GLUCOPHAGE) 500 MG tablet Take 1 tablet (500 mg total) by mouth 2 (two) times daily with a meal.  . NIFEdipine (PROCARDIA XL/NIFEDICAL XL) 60 MG 24 hr tablet Take 1 tablet (60 mg total) by mouth daily.    Review of Systems: Cardiovascular: negative for chest pain, palpitations, leg swelling, orthopnea Respiratory: negative for SOB, wheezing or persistent cough Gastrointestinal: negative for abdominal pain Genitourinary: negative for dysuria or gross hematuria  Objective  Vitals: BP 134/82   Pulse 88   Temp 98.4 F (36.9 C) (Temporal)   Ht _0  (1.6 m)   Wt 133 lb 12.8 oz (60.7 kg)   LMP 11/23/2020 (Exact Date)   SpO2 99%   BMI 23.70 kg/m  General: no acute distress  Psych:  Alert and oriented, normal mood and affect HEENT:  Normocephalic, atraumatic, supple neck  Cardiovascular:  RRR without murmur. no edema Respiratory:  Good breath sounds bilaterally, CTAB with normal respiratory effort Skin:  Warm, no rashes Neurologic:   Mental status is normal  Commons side effects, risks, benefits, and alternatives for medications and treatment plan prescribed today were discussed, and the patient expressed understanding of the given  instructions. Patient is instructed to call or message via MyChart if he/she has any questions or concerns regarding our treatment plan. No barriers to understanding were identified. We discussed Red Flag symptoms and signs in detail. Patient expressed understanding regarding what to do in case of urgent or emergency type symptoms.   Medication list was reconciled, printed and provided to the patient in AVS. Patient instructions and summary information was reviewed with the patient as documented in the AVS. This note was prepared with assistance of Dragon voice recognition software. Occasional wrong-word or sound-a-like substitutions may have occurred due to the inherent limitations of voice recognition software  This visit occurred during the SARS-CoV-2 public health emergency.  Safety protocols were in place, including screening questions prior to the visit, additional usage of staff PPE, and extensive cleaning of exam room while observing appropriate contact time as indicated for disinfecting solutions.

## 2020-12-01 NOTE — Patient Instructions (Signed)
Please return in 6 months 12 months for your annual complete physical; please come fasting.  I have referred you to an obstetrician to help with fertility.  Continue your current medications.   If you have any questions or concerns, please don't hesitate to send me a message via MyChart or call the office at [phone]. Thank you for visiting with [place] today! It's our pleasure caring for you.

## 2021-01-21 ENCOUNTER — Other Ambulatory Visit: Payer: Self-pay | Admitting: Family Medicine

## 2021-01-21 DIAGNOSIS — I10 Essential (primary) hypertension: Secondary | ICD-10-CM

## 2021-02-10 ENCOUNTER — Ambulatory Visit (HOSPITAL_BASED_OUTPATIENT_CLINIC_OR_DEPARTMENT_OTHER): Payer: BC Managed Care – PPO | Admitting: Obstetrics & Gynecology

## 2021-02-25 ENCOUNTER — Other Ambulatory Visit: Payer: Self-pay

## 2021-02-25 ENCOUNTER — Encounter (HOSPITAL_BASED_OUTPATIENT_CLINIC_OR_DEPARTMENT_OTHER): Payer: Self-pay | Admitting: Obstetrics & Gynecology

## 2021-02-25 ENCOUNTER — Ambulatory Visit (HOSPITAL_BASED_OUTPATIENT_CLINIC_OR_DEPARTMENT_OTHER): Payer: BC Managed Care – PPO | Admitting: Obstetrics & Gynecology

## 2021-02-25 VITALS — BP 146/104 | HR 79 | Ht 63.5 in | Wt 138.6 lb

## 2021-02-25 DIAGNOSIS — Z319 Encounter for procreative management, unspecified: Secondary | ICD-10-CM | POA: Diagnosis not present

## 2021-02-25 DIAGNOSIS — E282 Polycystic ovarian syndrome: Secondary | ICD-10-CM

## 2021-02-25 DIAGNOSIS — Z3141 Encounter for fertility testing: Secondary | ICD-10-CM | POA: Diagnosis not present

## 2021-02-25 DIAGNOSIS — Z9889 Other specified postprocedural states: Secondary | ICD-10-CM

## 2021-02-25 DIAGNOSIS — N970 Female infertility associated with anovulation: Secondary | ICD-10-CM | POA: Diagnosis not present

## 2021-02-25 DIAGNOSIS — R7303 Prediabetes: Secondary | ICD-10-CM

## 2021-02-25 DIAGNOSIS — Z8759 Personal history of other complications of pregnancy, childbirth and the puerperium: Secondary | ICD-10-CM

## 2021-02-25 DIAGNOSIS — Z8742 Personal history of other diseases of the female genital tract: Secondary | ICD-10-CM

## 2021-02-25 DIAGNOSIS — E038 Other specified hypothyroidism: Secondary | ICD-10-CM

## 2021-02-25 MED ORDER — LETROZOLE 2.5 MG PO TABS: ORAL_TABLET | ORAL | 0 refills | Status: DC

## 2021-02-26 ENCOUNTER — Encounter (HOSPITAL_BASED_OUTPATIENT_CLINIC_OR_DEPARTMENT_OTHER): Payer: Self-pay | Admitting: Obstetrics & Gynecology

## 2021-02-26 DIAGNOSIS — Z9889 Other specified postprocedural states: Secondary | ICD-10-CM

## 2021-02-26 DIAGNOSIS — Z8759 Personal history of other complications of pregnancy, childbirth and the puerperium: Secondary | ICD-10-CM

## 2021-02-26 DIAGNOSIS — Z8742 Personal history of other diseases of the female genital tract: Secondary | ICD-10-CM | POA: Insufficient documentation

## 2021-02-26 HISTORY — DX: Personal history of other diseases of the female genital tract: Z87.42

## 2021-02-26 HISTORY — DX: Personal history of other complications of pregnancy, childbirth and the puerperium: Z87.59

## 2021-02-26 HISTORY — DX: Other specified postprocedural states: Z98.890

## 2021-02-26 NOTE — Progress Notes (Signed)
GYNECOLOGY  VISIT  CC:   h/o PCOS, desires pregnancy  HPI: 35 y.o. [id number] Married [place] female here for discussion of desired pregnancy.  Accompanied by her husband today.  Referred from Dr. Jonni Sanger.  Pt has hx of PCOS and prediabetes.  Currently on Glucophage.  Ultrasound images reviewed personally and results discussed with pt/spouse.  Uterus normal.  Endometrium normal.  Ovaries enlarged with volume 27 and 14cm3 with many peripheral follicles.  SHGM showed normal intrauterine findings.  Pt has long hx of irregular cycles although much more normal last two years.  Did conceive naturally twice in the past but has inseminations.  First pregnancy had early dilation with rupture of membranes at 16 weeks.  Second pregnancy had cerclage with progesterone injections weekly and vaginally.  Complicated by severe pre-eclampsia (pt reports severe and rapidly changing BP at 34 weeks) with cesarean section for delivery.  Daughter is 8 and did not need to be in NICU.  Was in [place] for all of this.  Husband works for [employer] and this is what brought them to the [place].  Ready to try for another pregnancy.  On PNV.  On Glucophage.    Fertility testing discussed including semen analysis, fertility testing for pt and ovulation inductions agents.  Medications side effects discussed.  Twin and triple rate discussed.  Also, feel she may benefit from seeing REI so will go ahead with referral.  Husband with many questions.  These were all addressed.  Neither or spouse smoke or use marijuana.   Patient Active Problem List   Diagnosis Date Noted   Prediabetes 12/01/2020   Varicose veins of both lower extremities with pain 08/25/2020   Subclinical hypothyroidism 07/17/2018   Infertility associated with anovulation 07/17/2018   Essential hypertension 07/17/2018   Cholelithiases - asymptomatic 07/17/2018   PCOS (polycystic ovarian syndrome)     Past Medical History:  Diagnosis Date   Cervical incompetence     Cholelithiases - asymptomatic 07/17/2018   By ultrasound   Essential hypertension 07/17/2018   Hypertension    Infertility associated with anovulation    PCOS (polycystic ovarian syndrome)    Subclinical hypothyroidism 07/17/2018    Past Surgical History:  Procedure Laterality Date   CERVICAL CERCLAGE  2014   CESAREAN SECTION  2014    MEDS:   Current Outpatient Medications on File Prior to Visit  Medication Sig Dispense Refill   levothyroxine (SYNTHROID) 25 MCG tablet TAKE 1 TABLET DAILY 90 tablet 3   metFORMIN (GLUCOPHAGE) 500 MG tablet Take 1 tablet (500 mg total) by mouth 2 (two) times daily with a meal. 180 tablet 3   Multiple Vitamin (MULTIVITAMIN WITH MINERALS) TABS tablet Take 1 tablet by mouth daily.     NIFEdipine (PROCARDIA XL/NIFEDICAL XL) 60 MG 24 hr tablet TAKE 1 TABLET DAILY 180 tablet 3   cholecalciferol (VITAMIN D3) 25 MCG (1000 UT) tablet Take 1,000 Units by mouth daily. (Patient not taking: Reported on 02/25/2021)     ferrous sulfate 325 (65 FE) MG EC tablet Take 325 mg by mouth daily with breakfast. (Patient not taking: Reported on 02/25/2021)     No current facility-administered medications on file prior to visit.    ALLERGIES: Labetalol hcl  Family History  Problem Relation Age of Onset   Hypertension Mother    Diabetes Father    Thyroid disease Father    Healthy Daughter     SH:  married, non smoker  Review of Systems  Genitourinary:  Positive for menstrual problem.  All other systems reviewed and are negative.  PHYSICAL EXAMINATION:    BP (!) 146/104 (BP Location: Left Arm, Patient Position: Sitting, Cuff Size: Small)   Pulse 79   Ht 5' 3.5" (1.613 m)   Wt 138 lb 9.6 oz (62.9 kg)   LMP 02/15/2021 Comment: Spotting started on 21st. Blackish blood per patient. Period started 02/17/2021  BMI 24.17 kg/m     Physical Exam Constitutional:      Appearance: Normal appearance.  Pulmonary:     Effort: Pulmonary effort is normal.  Neurological:      General: No focal deficit present.     Mental Status: She is alert.  Psychiatric:        Mood and Affect: Mood normal.    Assessment/Plan: 1. Patient desires pregnancy - evaluation for fertility discussed as well as treatment options.  Will plan to start with fermara 2.5mg  days 3-7 with next cycle.  Risks discussed.  Pt is to let me know when starts cycle to decide when to have day 21 progesterone level obtained - semen analysis testing kit given and instructions provided.  Order will be faxed to [facility] obtained on pt today  2. Prediabetes - continue glucophage 500mg  BID  3. Infertility associated with anovulation  4. Subclinical hypothyroidism  5.  H/o incompetent cervix - will need cerclage and close monitoring with pregnancy  6.  H/o severe preeclampsia - ASA and monitoring in pregnancy discussed  7.  Hypertension  ~60 minutes spent with pt and spouse in direct face to face communication/discussion, review of records, ultrasound images, out patient labs, discussion of treatment options, referral, pregnancy risks

## 2021-03-03 LAB — SPECIMEN STATUS REPORT

## 2021-03-04 LAB — ANTI MULLERIAN HORMONE: ANTI-MULLERIAN HORMONE (AMH): 4.33 ng/mL

## 2021-03-10 ENCOUNTER — Telehealth (HOSPITAL_BASED_OUTPATIENT_CLINIC_OR_DEPARTMENT_OTHER): Payer: Self-pay

## 2021-03-10 NOTE — Telephone Encounter (Signed)
Called office to inquire about referral sent to the office for patient. I had to [facility] for Angelica Harris to give me a call back to see where they are with appointment as our office has not been informed about an appointment. tbw

## 2021-03-11 NOTE — Telephone Encounter (Signed)
Spoke with patient 03/10/2021 and patient states that the office did call her to set up an appointment. She declined to make an appointment at that time as she was going to speak with her husband about it. tbw

## 2021-03-19 ENCOUNTER — Telehealth (HOSPITAL_BASED_OUTPATIENT_CLINIC_OR_DEPARTMENT_OTHER): Payer: Self-pay

## 2021-03-19 DIAGNOSIS — N970 Female infertility associated with anovulation: Secondary | ICD-10-CM

## 2021-03-19 NOTE — Telephone Encounter (Signed)
Patient called 03/18/2021 to let us know that she has started her period. She states that she had a little brown on 03/17/2021, but is now a heavier flow. Please advise. tbw

## 2021-03-19 NOTE — Telephone Encounter (Signed)
TC to pt verified pt by DOB and name.   LMP 03/18/21  D3 9/23 To begin Femara 2.5mg  D3-7 (Pt has RX already) with D21 progesterone level to check for ovulation. Order placed for lab and apt scheduled for lab.  Pt's husband has not had SA yet.  Encouraged and voiced importance of SA.  Pt understood.   Dr Hyacinth Meeker verbally informed of this and message sent to her. KW CMA

## 2021-03-27 ENCOUNTER — Other Ambulatory Visit (HOSPITAL_BASED_OUTPATIENT_CLINIC_OR_DEPARTMENT_OTHER): Payer: Self-pay | Admitting: Obstetrics & Gynecology

## 2021-04-01 ENCOUNTER — Other Ambulatory Visit (HOSPITAL_BASED_OUTPATIENT_CLINIC_OR_DEPARTMENT_OTHER): Payer: Self-pay | Admitting: Obstetrics & Gynecology

## 2021-04-01 ENCOUNTER — Telehealth (HOSPITAL_BASED_OUTPATIENT_CLINIC_OR_DEPARTMENT_OTHER): Payer: Self-pay | Admitting: Obstetrics & Gynecology

## 2021-04-01 DIAGNOSIS — N970 Female infertility associated with anovulation: Secondary | ICD-10-CM

## 2021-04-01 NOTE — Telephone Encounter (Signed)
Called pt as RF request for letrazole came today.  Pt's LMP was 9/21 and she started taking 2.5mg  dosage on day 3, 9/23.  Day 21 progesterone level is scheduled for 10/12 but should be 10/11.  Pt aware should come a day earlier.  Future lab order placed.  Will have her added to nurse schedule that day.  She is planning on coming at 9am.  Reviewed above with pt and she voiced clear understanding.

## 2021-04-07 ENCOUNTER — Other Ambulatory Visit: Payer: Self-pay

## 2021-04-07 ENCOUNTER — Other Ambulatory Visit (HOSPITAL_BASED_OUTPATIENT_CLINIC_OR_DEPARTMENT_OTHER): Payer: BC Managed Care – PPO

## 2021-04-07 DIAGNOSIS — N970 Female infertility associated with anovulation: Secondary | ICD-10-CM | POA: Diagnosis not present

## 2021-04-08 LAB — PROGESTERONE: Progesterone: 25.2 ng/mL

## 2021-04-15 ENCOUNTER — Telehealth (HOSPITAL_BASED_OUTPATIENT_CLINIC_OR_DEPARTMENT_OTHER): Payer: Self-pay

## 2021-04-15 NOTE — Telephone Encounter (Signed)
Patient called today with a question about her period.She states the following:  04/12/2021 She experienced some brown        discharge on the tissue only.  04/13/2021  She experienced some brown and pink discharge on the tissue only.  04/14/2021  She expreienced some dark brown discharge on the tissue only.  04/15/2021  Today she has experience brown discharge and it has been a constant flow.  She would like to know what day should she count as being the first day.  Please advise.  She also states that her husband has an appointment for semen analysis on 05/15/2021.

## 2021-04-16 ENCOUNTER — Other Ambulatory Visit (HOSPITAL_BASED_OUTPATIENT_CLINIC_OR_DEPARTMENT_OTHER): Payer: Self-pay | Admitting: Obstetrics & Gynecology

## 2021-04-16 MED ORDER — LETROZOLE 2.5 MG PO TABS: ORAL_TABLET | ORAL | 0 refills | Status: DC

## 2021-04-20 NOTE — Telephone Encounter (Signed)
Called patient to inform her of the below message per Dr. Hyacinth Meeker. Patient states she started the pill on the 21st. tbw

## 2021-05-09 ENCOUNTER — Other Ambulatory Visit (HOSPITAL_BASED_OUTPATIENT_CLINIC_OR_DEPARTMENT_OTHER): Payer: Self-pay | Admitting: Obstetrics & Gynecology

## 2021-05-18 ENCOUNTER — Telehealth: Payer: Self-pay

## 2021-05-18 NOTE — Telephone Encounter (Signed)
fyi

## 2021-05-18 NOTE — Telephone Encounter (Signed)
Nurse Assessment Nurse: Angelica Backers, RN, [place] Date/Time (Eastern Time): 05/16/2021 5:47:32 PM Confirm and document reason for call. If symptomatic, describe symptoms. ---Caller states she ran out of her Nifedipine, ordered with [facility] and has been without it for two days. She feels very Fatigue, and Dizzy. Current BP 158/108 Left arm.  Does the patient have any new or worsening symptoms? ---Yes Will a triage be completed? ---Yes Related visit to physician within the last 2 weeks? ---No Does the PT have any chronic conditions? (i.e. diabetes, asthma, this includes High risk factors for pregnancy, etc.) ---Yes List chronic conditions. ---HTN Is the patient pregnant or possibly pregnant? (Ask all females between the ages of 67-55) ---No Is this a behavioral health or substance abuse call? ---No  Blood Pressure - High [1] Systolic BP >= 160 OR Diastolic >= 100 AND  [2]cardiac or neurologic symptoms (e.g., chest pain, difficulty breathing, unsteady gait, blurred vision) Angelica Backers, RN, [place] 05/16/2021 5:53:05 PM  05/16/2021 2:39:01 PM Send to Clinical Peggye Form, RN, Windy 05/16/2021 5:41:47 PM Send To Call Back Waiting For Nurse Cherrie Distance 05/16/2021 5:42:58 PM Send To Clinical Follow Up Harvie Junior, RN, Beth 05/16/2021 5:54:37 PM Go to ED Now Yes Walker-Foster, RN, Ebone  GO TO ED NOW: NOTE TO TRIAGER - DRIVING: * Another adult should drive. CALL EMS 911 IF: * You become worse CARE ADVICE given per High Blood Pressure (Adult) guideline

## 2021-07-02 ENCOUNTER — Other Ambulatory Visit: Payer: Self-pay | Admitting: Family Medicine

## 2021-08-18 ENCOUNTER — Encounter: Payer: Self-pay | Admitting: Family Medicine

## 2021-08-18 NOTE — Telephone Encounter (Signed)
Please advise 

## 2021-09-10 IMAGING — US US PELVIS COMPLETE WITH TRANSVAGINAL
1 series · 13 of 25 positions shown · non-contrast
Comparison: None

CLINICAL DATA: Post coital bleeding.  Desire for fertility

EXAM:
TRANSABDOMINAL AND TRANSVAGINAL ULTRASOUND OF PELVIS
TECHNIQUE: Both transabdominal and transvaginal ultrasound examinations of the
pelvis were performed. Transabdominal technique was performed for
global imaging of the pelvis including uterus, ovaries, adnexal
regions, and pelvic cul-de-sac. It was necessary to proceed with
endovaginal exam following the transabdominal exam to visualize the
adnexal regions.

[Series 1: us pelvis complete with transvaginal · 0.18mm/px · 13 of 106 slices shown]
[im 1/106]
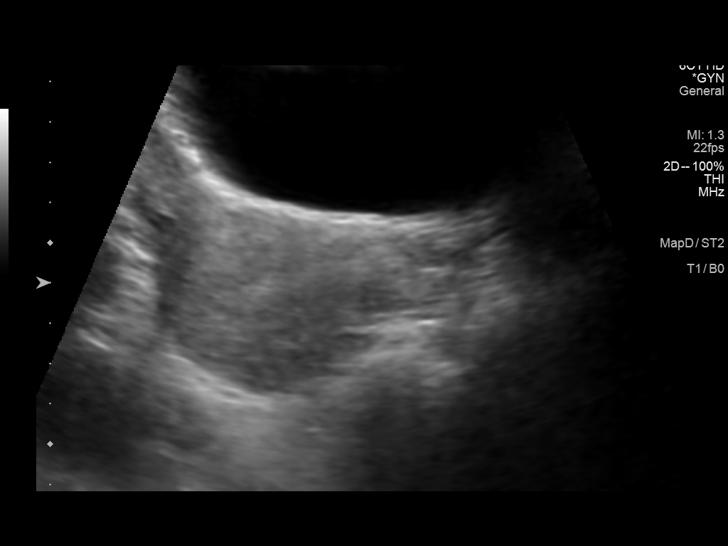
[im 9/106]
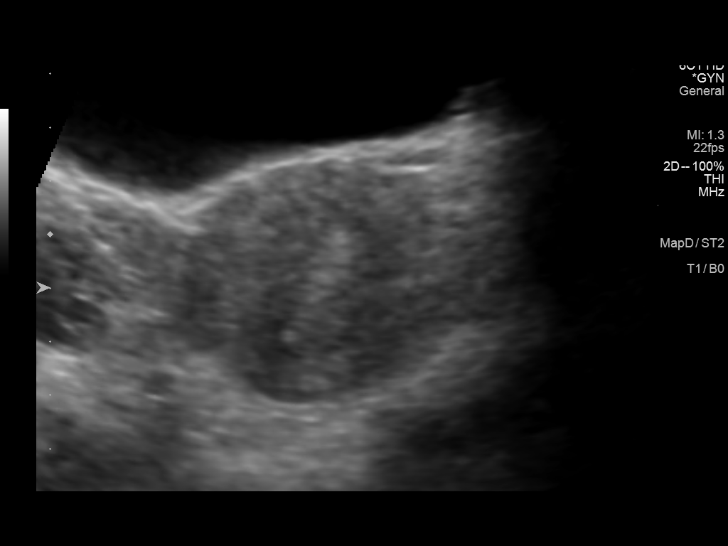
[im 18/106]
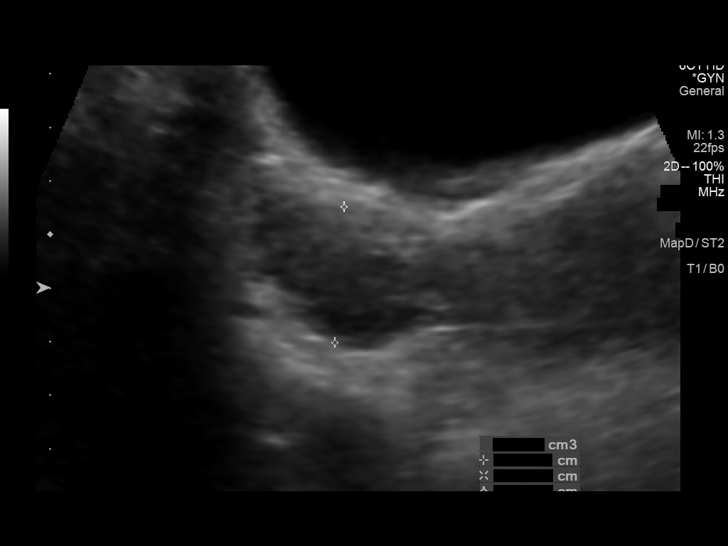
[im 27/106]
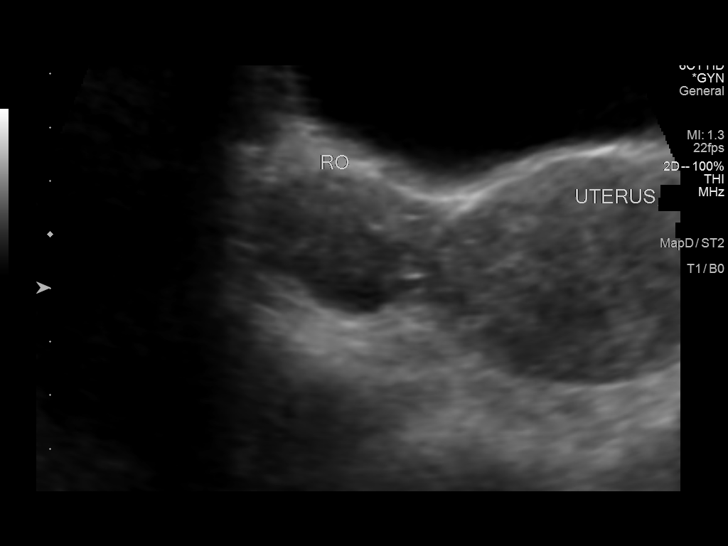
[im 36/106]
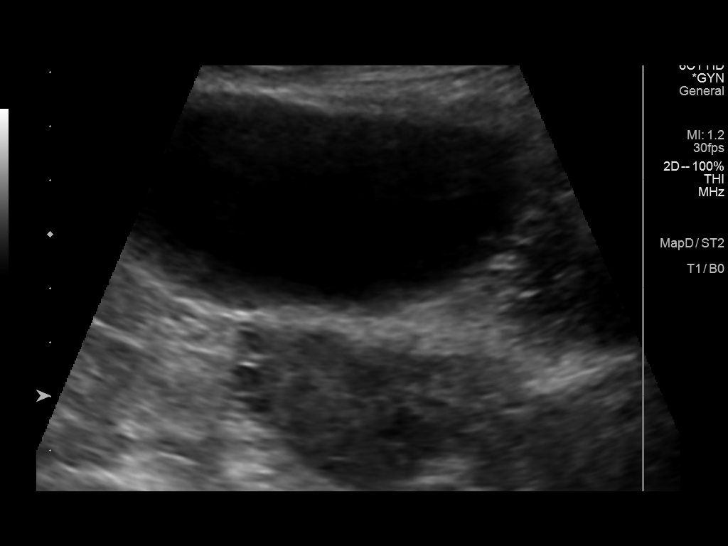
[im 44/106]
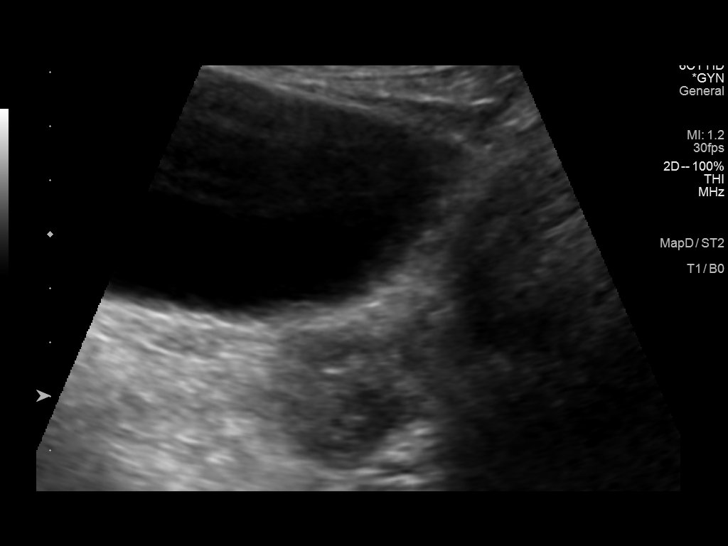
[im 53/106]
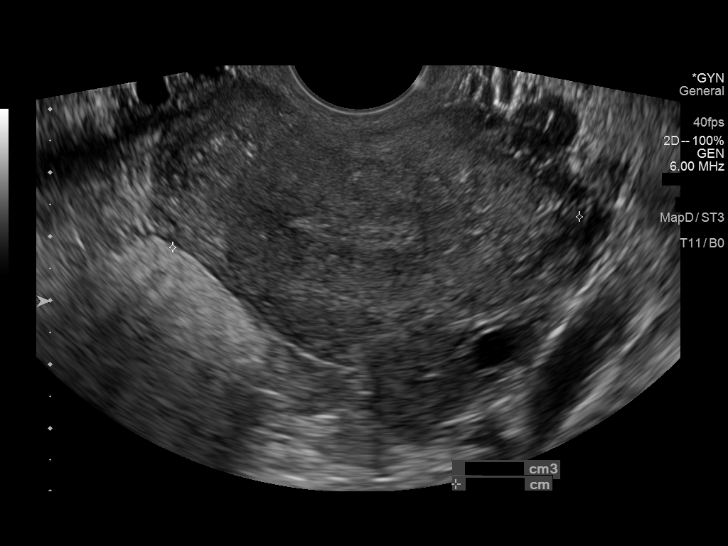
[im 62/106]
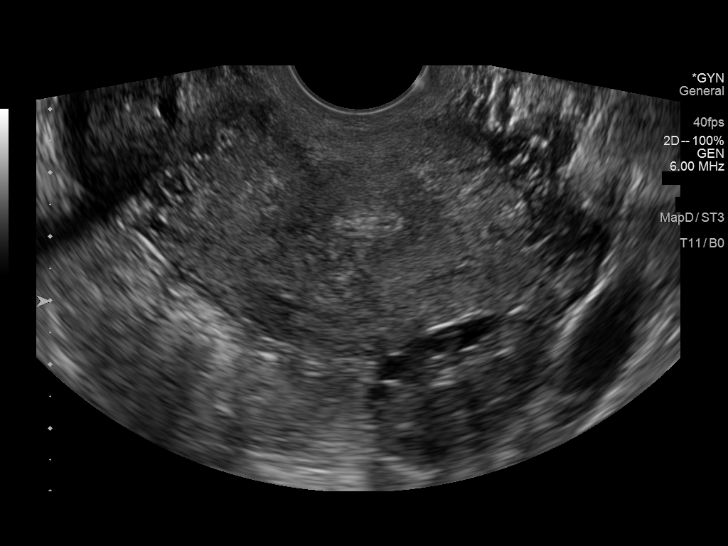
[im 71/106]
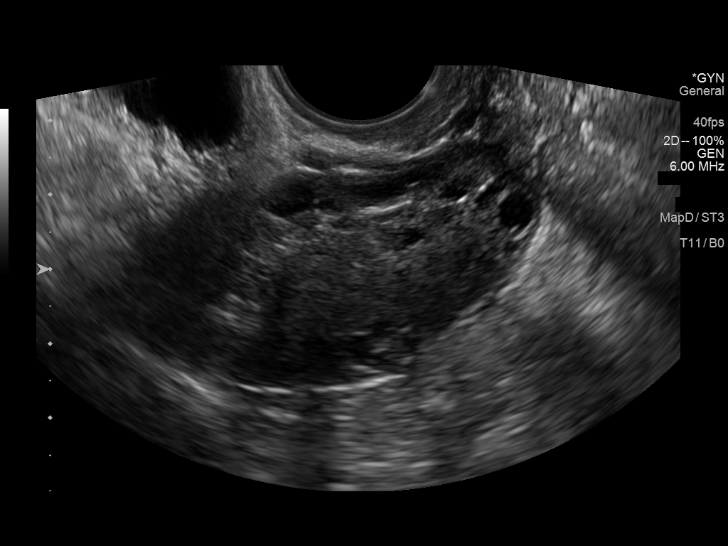
[im 79/106]
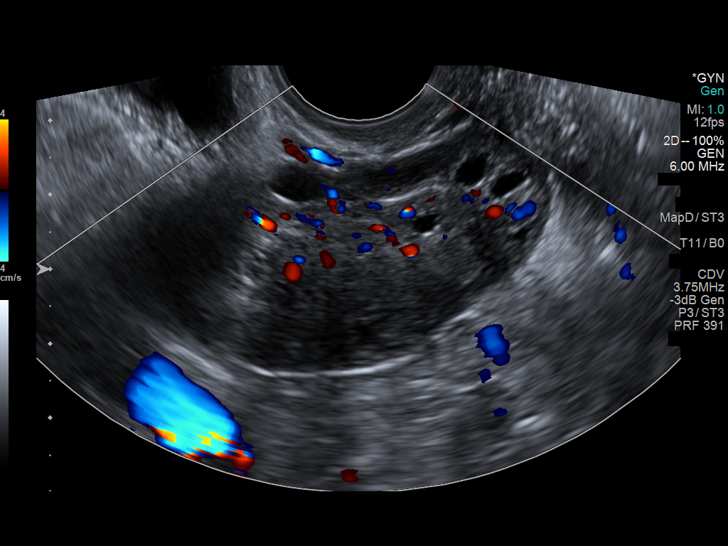
[im 88/106]
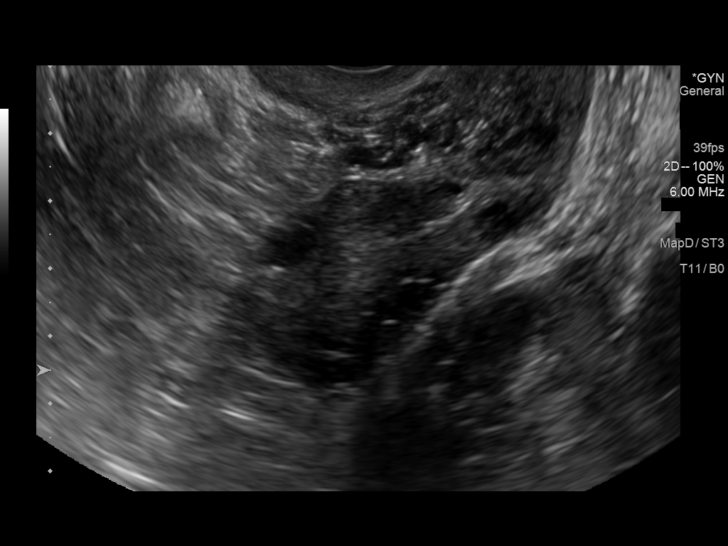
[im 97/106]
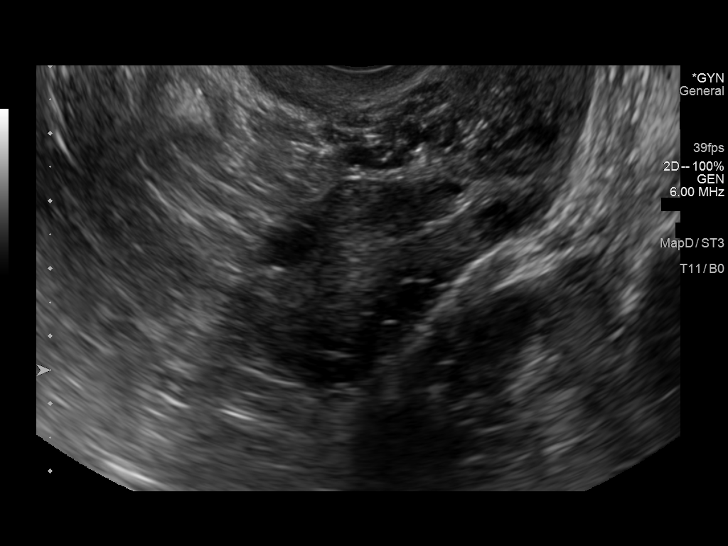
[im 106/106]
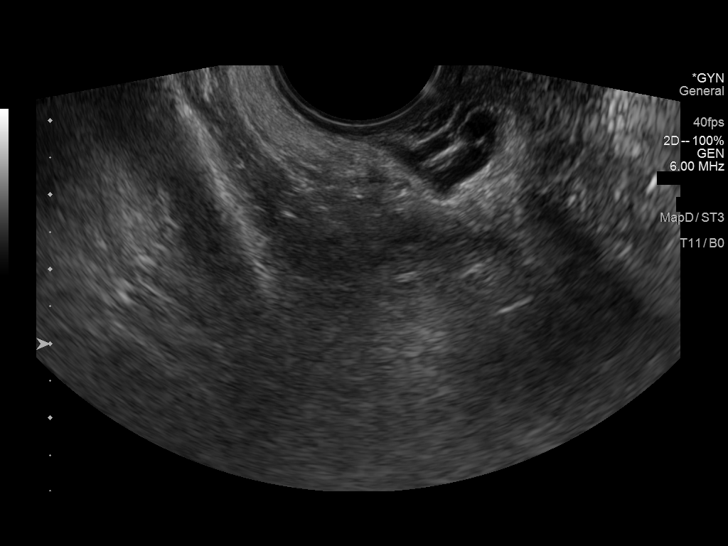

[13 of 25 positions shown; findings below may reference images not displayed]

FINDINGS: Uterus

Measurements: 6.3 x 5.0 x 6.4 cm = volume: 104.6 mL. No fibroids or
other mass visualized.

Endometrium

Thickness: 5.5 mm.  No focal abnormality visualized.

Right ovary

Measurements: 6.1 x 3.0 x 2.9 cm = volume: 27.6 mL. Multiple
prominent, peripheral follicles are identified measuring up to 6 mm.

Left ovary

Measurements: 4.7 x 2.3 x 2.1 cm = volume: 14.1 ML. Normal
appearance/no adnexal mass. Several prominent peripheral follicles
identified.

Other findings

No abnormal free fluid.
IMPRESSION: 1. Normal appearance of the endometrium.
2. Bilateral ovarian enlargement, left greater than right. Findings
meet sonographic criteria for polycystic ovarian syndrome (PCOS).
Per 6551 consensus conference statement by the European Society for
Human Reproduction and Embryology and the American Society for
Reproductive Medicine

## 2021-09-10 IMAGING — US US SONOHYSTEROGRAM - WITH RAD
1 series · 3 of 3 positions shown · non-contrast
Comparison: None.

CLINICAL DATA: Desires fertility.  Post coital bleeding.

EXAM:
US SONOHYSTEROGRAM
TECHNIQUE: Following cleansing of the cervix and vagina with Betadine, a
hysterosalpingogram catheter was placed within the endocervical
canal. Sonohysterogram was then performed with transvaginal
sonography during infusion of sterile saline solution into the
endometrial cavity.

[Series 1: us sonohysterogram - with rad · 0.09mm/px · 3 acquisitions, 3 frames shown]
[im 1/3]
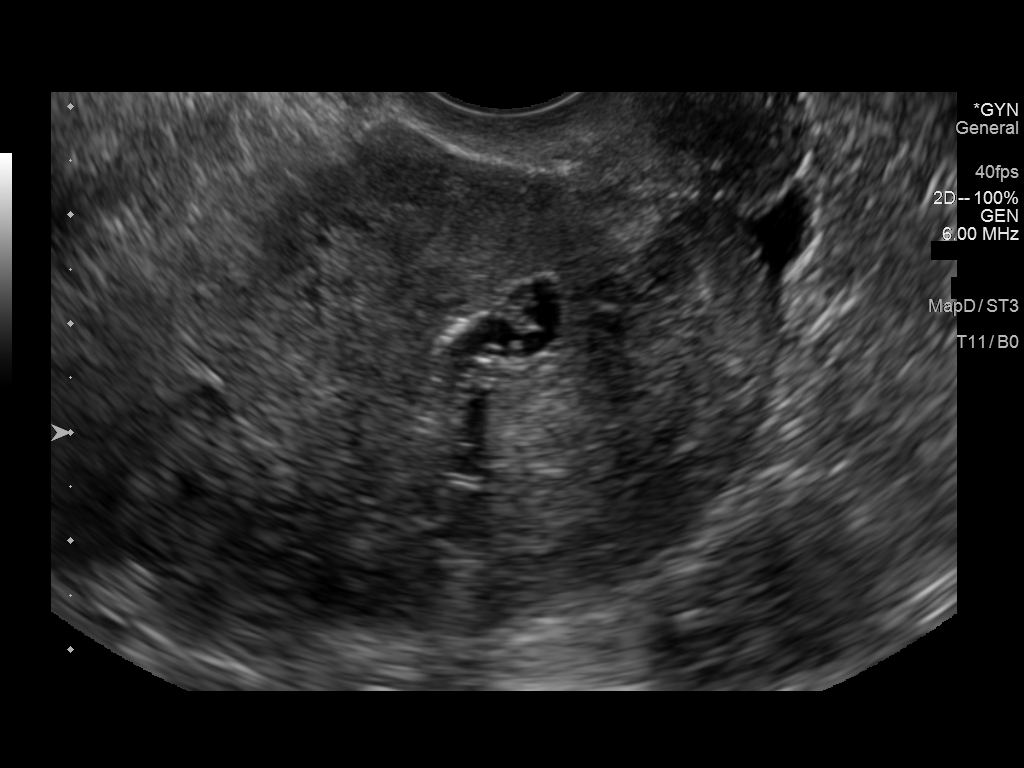
[im 2/3]
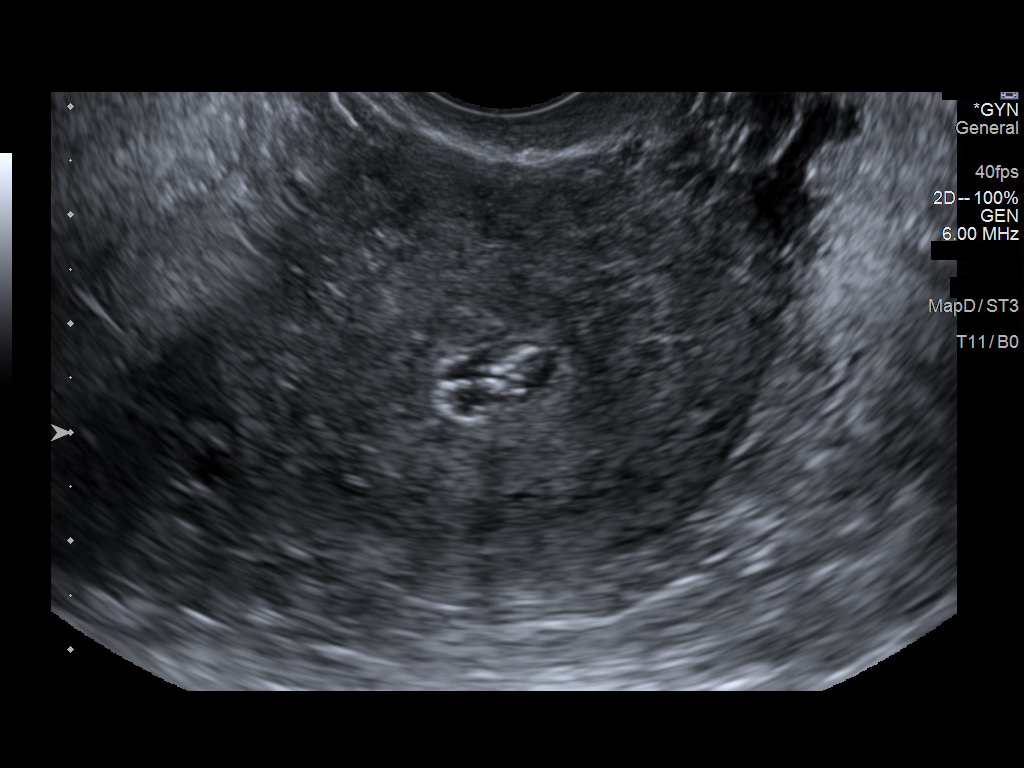
[im 3/3]
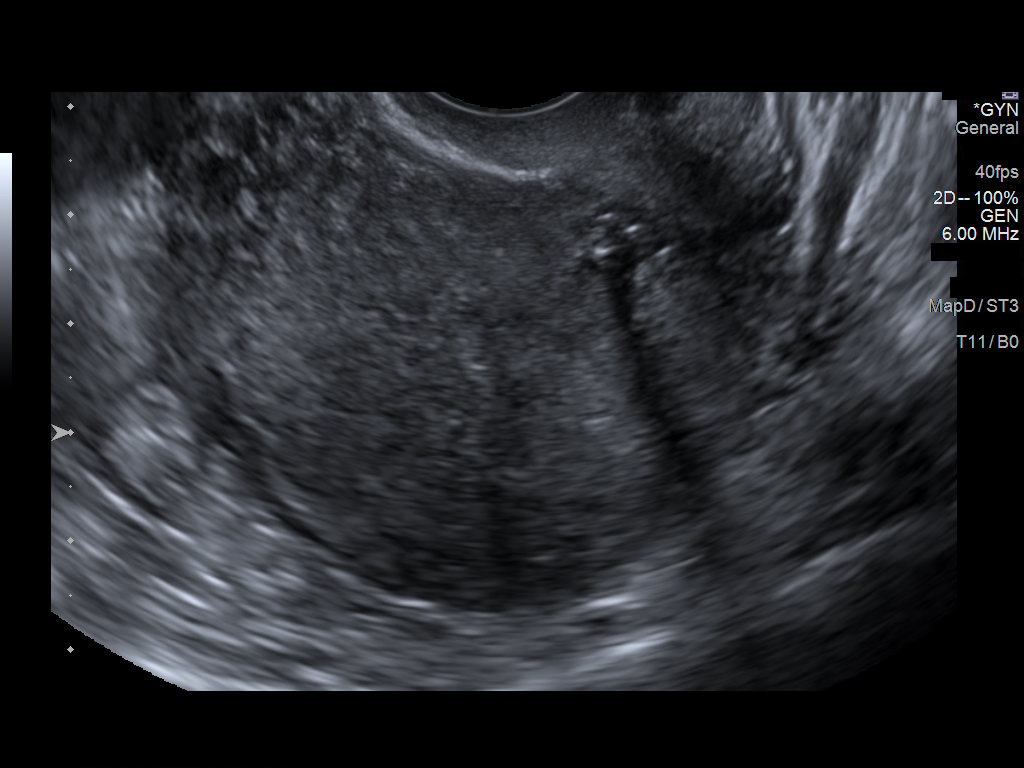

[3 of 3 positions shown; findings below may reference images not displayed]

FINDINGS: No polyps, masses or focal areas of thickening are seen involving
the endometrium. No submucosal fibroids or other abnormality of the
endometrial cavity identified.
IMPRESSION: Negative exam.

## 2021-09-23 ENCOUNTER — Encounter: Payer: Self-pay | Admitting: Family Medicine

## 2021-09-23 ENCOUNTER — Ambulatory Visit (INDEPENDENT_AMBULATORY_CARE_PROVIDER_SITE_OTHER): Payer: BC Managed Care – PPO | Admitting: Family Medicine

## 2021-09-23 VITALS — BP 130/80 | HR 80 | Temp 98.7°F | Ht 63.5 in | Wt 139.8 lb

## 2021-09-23 DIAGNOSIS — Z0000 Encounter for general adult medical examination without abnormal findings: Secondary | ICD-10-CM

## 2021-09-23 DIAGNOSIS — I10 Essential (primary) hypertension: Secondary | ICD-10-CM

## 2021-09-23 DIAGNOSIS — E038 Other specified hypothyroidism: Secondary | ICD-10-CM | POA: Diagnosis not present

## 2021-09-23 DIAGNOSIS — N970 Female infertility associated with anovulation: Secondary | ICD-10-CM

## 2021-09-23 DIAGNOSIS — R7303 Prediabetes: Secondary | ICD-10-CM

## 2021-09-23 DIAGNOSIS — E282 Polycystic ovarian syndrome: Secondary | ICD-10-CM

## 2021-09-23 LAB — TSH: TSH: 1.61 u[IU]/mL (ref 0.35–5.50)

## 2021-09-23 LAB — CBC WITH DIFFERENTIAL/PLATELET
Basophils Absolute: 0 10*3/uL (ref 0.0–0.1)
Basophils Relative: 0.5 % (ref 0.0–3.0)
Eosinophils Absolute: 0.1 10*3/uL (ref 0.0–0.7)
Eosinophils Relative: 0.9 % (ref 0.0–5.0)
HCT: 37.9 % (ref 36.0–46.0)
Hemoglobin: 12.9 g/dL (ref 12.0–15.0)
Lymphocytes Relative: 18.7 % (ref 12.0–46.0)
Lymphs Abs: 1.8 10*3/uL (ref 0.7–4.0)
MCHC: 34 g/dL (ref 30.0–36.0)
MCV: 88.9 fl (ref 78.0–100.0)
Monocytes Absolute: 0.5 10*3/uL (ref 0.1–1.0)
Monocytes Relative: 5.8 % (ref 3.0–12.0)
Neutro Abs: 6.9 10*3/uL (ref 1.4–7.7)
Neutrophils Relative %: 74.1 % (ref 43.0–77.0)
Platelets: 348 10*3/uL (ref 150.0–400.0)
RBC: 4.26 Mil/uL (ref 3.87–5.11)
RDW: 12.9 % (ref 11.5–15.5)
WBC: 9.4 10*3/uL (ref 4.0–10.5)

## 2021-09-23 LAB — COMPREHENSIVE METABOLIC PANEL
ALT: 16 U/L (ref 0–35)
AST: 15 U/L (ref 0–37)
Albumin: 4.6 g/dL (ref 3.5–5.2)
Alkaline Phosphatase: 37 U/L — ABNORMAL LOW (ref 39–117)
BUN: 13 mg/dL (ref 6–23)
CO2: 25 mEq/L (ref 19–32)
Calcium: 10.1 mg/dL (ref 8.4–10.5)
Chloride: 102 mEq/L (ref 96–112)
Creatinine, Ser: 0.68 mg/dL (ref 0.40–1.20)
GFR: 111.19 mL/min (ref >60.00)
Glucose, Bld: 92 mg/dL (ref 70–99)
Potassium: 3.9 mEq/L (ref 3.5–5.1)
Sodium: 136 mEq/L (ref 135–145)
Total Bilirubin: 0.5 mg/dL (ref 0.2–1.2)
Total Protein: 7.5 g/dL (ref 6.0–8.3)

## 2021-09-23 LAB — LIPID PANEL
Cholesterol: 182 mg/dL (ref 0–200)
HDL: 50.3 mg/dL (ref >39.00)
LDL Cholesterol: 93 mg/dL (ref 0–99)
NonHDL: 131.73
Total CHOL/HDL Ratio: 4
Triglycerides: 196 mg/dL — ABNORMAL HIGH (ref 0.0–149.0)
VLDL: 39.2 mg/dL (ref 0.0–40.0)

## 2021-09-23 LAB — HEMOGLOBIN A1C: Hgb A1c MFr Bld: 6.3 % (ref 4.6–6.5)

## 2021-09-23 NOTE — Patient Instructions (Signed)
Please return in 3-6 months. I will clarify after getting your A1c back (diabetes test). ? ? I will release your lab results to you on your MyChart account with further instructions. You may see the results before I do, but when I review them I will send you a message with my report or have my assistant call you if things need to be discussed. Please reply to my message with any questions. Thank you!  ? ?If you have any questions or concerns, please don't hesitate to send me a message via MyChart or call the office at [phone]. Thank you for visiting with [place] today! It's our pleasure caring for you.  ? ?Please do these things to maintain good health! ? ?Exercise at least 30-45 minutes a day,  4-5 days a week.  ?Eat a low-fat diet with lots of fruits and vegetables, up to 7-9 servings per day. ?Drink plenty of water daily. Try to drink 8 8oz glasses per day. ?Seatbelts can save your life. Always wear your seatbelt. ?Place Smoke Detectors on every level of your home and check batteries every year. ?Schedule an appointment with an eye doctor for an eye exam every 1-2 years ?Safe sex - use condoms to protect yourself from STDs if you could be exposed to these types of infections. Use birth control if you do not want to become pregnant and are sexually active. ?Avoid heavy alcohol use. If you drink, keep it to less than 2 drinks/day and not every day. ?Health Care Power of Attorney.  Choose someone you trust that could speak for you if you became unable to speak for yourself. ?Depression is common in our stressful world.If you're feeling down or losing interest in things you normally enjoy, please come in for a visit. ?If anyone is threatening or hurting you, please get help. Physical or Emotional Violence is never OK.   ?

## 2021-09-23 NOTE — Progress Notes (Signed)
? ?Subjective  ?Chief Complaint  ?Patient presents with  ? Annual Exam  ?  Pt has no concerns today. She has had lite breakfast only.   ? Hyperlipidemia  ? Hypertension  ? Hypothyroidism  ? Pre Diabetes  ? ? ?HPI: Angelica Harris is a 36 y.o. female who presents to [facility] today for a Female Wellness Visit.  ?She also has the concerns and/or needs as listed above in the chief complaint. These will be addressed in addition to the Health Maintenance Visit.  ? ?Wellness Visit: annual visit with health maintenance review and exam without Pap ? ?Health maintenance is all current.  Feels well.  Diet has been a bit more carefree.  She has gained a few pounds. ?Chronic disease management visit and/or acute problem visit: ?Infertility associated with PCOS and anovulation: Reviewed OB notes.  She did try to get pregnant but was unsuccessful.  Since then, she and her husband have decided they are happy with their 1 daughter.  We will no longer try to get pregnant.  No birth control needed.  Menstrual cycles are regular now on metformin twice daily. ?Prediabetes with 1 A1c elevated at 6.6 last year.  She denies symptoms of hyperglycemia.  She is on metformin 500 twice daily. ?Subclinical hypothyroidism on low-dose levothyroxine 25 daily.  Due for recheck.  Feels energy levels are good. ?Hypertension: Has been on Procardia XL 60 mg and had been well controlled.  No longer try to get pregnant.  Has not been on other medications.  A bit stressed, her husband is closing on home soon, they will be moving.  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  Her routine is off.  But overall very happy and excited about this changes ? ?Assessment  ?1. Annual physical exam   ?2. Essential hypertension   ?3. PCOS (polycystic ovarian syndrome)   ?4. Infertility associated with anovulation   ?5. Prediabetes   ?6. Subclinical hypothyroidism   ? ?  ?Plan  ?Female  Wellness Visit: ?Age appropriate Health Maintenance and Prevention measures were discussed with patient. Included topics are cancer screening recommendations, ways to keep healthy (see AVS) including dietary and exercise recommendations, regular eye and dental care, use of seat belts, and avoidance of moderate alcohol use and tobacco use.  Screens are current ?BMI: discussed patient's BMI and encouraged positive lifestyle modifications to help get to or maintain a target BMI. ?HM needs and immunizations were addressed and ordered. See below for orders. See HM and immunization section for updates. ?Routine labs and screening tests ordered including cmp, cbc and lipids where appropriate. ?Discussed recommendations regarding Vit D and calcium supplementation (see AVS) ? ?Chronic disease f/u and/or acute problem visit: (deemed necessary to be done in addition to the wellness visit): ?Hypertension: We will await A1c.  Consider changing ACE inhibitor.  Check renal function electrolytes and lipids ?Prediabetes: Check A1c.  At risk for developing diabetes.  Discussed improving diet again.  On metformin 500 twice daily.  Also for PCOS. ?PCOS with history of anovulation: Now having regular menstrual cycles.  No need for birth control. ?Recheck thyroid on low-dose supplements.  Levothyroxine 25 daily.  Can consider stopping this. ? ?Follow up: 3 to 6 months pending lab results, follow-up diabetes and blood pressure ? ?Orders Placed This Encounter  ?Procedures  ? CBC with Differential/Platelet  ? Comprehensive metabolic panel  ? Hemoglobin A1c  ? Lipid panel  ?  TSH  ? ?No orders of the defined types were placed in this encounter. ? ?  ? ? ?Body mass index is 24.38 kg/m?. ?Wt Readings from Last 3 Encounters:  ?09/23/21 139 lb 12.8 oz (63.4 kg)  ?02/25/21 138 lb 9.6 oz (62.9 kg)  ?12/01/20 133 lb 12.8 oz (60.7 kg)  ? ?Need for contraception: No,  ? ?Patient Active Problem List  ? Diagnosis Date Noted  ? Prediabetes 12/01/2020  ?   Priority: High  ? Subclinical hypothyroidism 07/17/2018  ?  Priority: High  ? Essential hypertension 07/17/2018  ?  Priority: High  ? Infertility associated with anovulation 07/17/2018  ?  Priority: Medium   ? Cholelithiases - asymptomatic 07/17/2018  ?  Priority: Medium   ?  By ultrasound ?  ? PCOS (polycystic ovarian syndrome)   ?  Priority: Medium   ? Varicose veins of both lower extremities with pain 08/25/2020  ?  Priority: Low  ? History of cervical incompetence 02/26/2021  ? History of severe pre-eclampsia 02/26/2021  ? History of cervical cerclage 02/26/2021  ? ?Health Maintenance  ?Topic Date Due  ? COVID-19 Vaccine (3 - Booster for Moderna series) 10/09/2021 (Originally 12/06/2019)  ? TETANUS/TDAP  02/23/2023  ? PAP SMEAR-Modifier  06/02/2025  ? Hepatitis C Screening  Completed  ? HIV Screening  Completed  ? HPV VACCINES  Aged Out  ? INFLUENZA VACCINE  Discontinued  ? ?Immunization History  ?Administered Date(s) Administered  ? Influenza,inj,Quad PF,6+ Mos 03/23/2019  ? Influenza-Unspecified 05/03/2020  ? Moderna Sars-Covid-2 Vaccination 09/13/2019, 10/11/2019  ? ?We updated and reviewed the patient's past history in detail and it is documented below. ?Allergies: ?Patient  reports no history of alcohol use. ?Past Medical History ?Patient  has a past medical history of Cervical incompetence, Cholelithiases - asymptomatic (07/17/2018), Essential hypertension (07/17/2018), Hypertension, Infertility associated with anovulation, PCOS (polycystic ovarian syndrome), and Subclinical hypothyroidism (07/17/2018). ?Past Surgical History ?Patient  has a past surgical history that includes Cervical cerclage (2014) and Cesarean section (2014). ?Social History  ? ?Socioeconomic History  ? Marital status: Married  ?  Spouse name: Not on file  ? Number of children: 1  ? Years of education: Not on file  ? Highest education level: Not on file  ?Occupational History  ? Occupation: stay at home mom  ?Tobacco Use  ? Smoking  status: Never  ? Smokeless tobacco: Never  ?Substance and Sexual Activity  ? Alcohol use: Never  ? Drug use: Never  ? Sexual activity: Yes  ?  Birth control/protection: None  ?Other Topics Concern  ? Not on file  ?Social History Narrative  ? Not on file  ? ?Social Determinants of Health  ? ?Financial Resource Strain: Not on file  ?Food Insecurity: Not on file  ?Transportation Needs: Not on file  ?Physical Activity: Not on file  ?Stress: Not on file  ?Social Connections: Not on file  ? ?Family History  ?Problem Relation Age of Onset  ? Hypertension Mother   ? Diabetes Father   ? Thyroid disease Father   ? Healthy Daughter   ? ? ?Review of Systems: ?Constitutional: negative for fever or malaise ?Ophthalmic: negative for photophobia, double vision or loss of vision ?Cardiovascular: negative for chest pain, dyspnea on exertion, or new LE swelling ?Respiratory: negative for SOB or persistent cough ?Gastrointestinal: negative for abdominal pain, change in bowel habits or melena ?Genitourinary: negative for dysuria or gross hematuria, no abnormal uterine bleeding or disharge ?Musculoskeletal: negative for new gait disturbance or muscular  weakness ?Integumentary: negative for new or persistent rashes, no breast lumps ?Neurological: negative for TIA or stroke symptoms ?Psychiatric: negative for SI or delusions ?Allergic/Immunologic: negative for hives ? ?Patient Care Team  ?  Relationship Specialty Notifications Start End  ?Willow OraAndy, Ahmani Prehn L, MD PCP - General Family Medicine  07/17/18   ? ? ?Objective  ?Vitals: BP 130/80   Pulse 80   Temp 98.7 ?F (37.1 ?C) (Temporal)   Ht 5' 3.5" (1.613 m)   Wt 139 lb 12.8 oz (63.4 kg)   LMP 09/02/2021 (Exact Date)   SpO2 100%   BMI 24.38 kg/m?  ?General:  Well developed, well nourished, no acute distress  ?Psych:  Alert and orientedx3,normal mood and affect ?HEENT:  Normocephalic, atraumatic, non-icteric sclera, PERRL, supple neck without adenopathy, mass or thyromegaly ?Cardiovascular:   Normal S1, S2, RRR without gallop, rub or murmur ?Respiratory:  Good breath sounds bilaterally, CTAB with normal respiratory effort ?Gastrointestinal: normal bowel sounds, soft, non-tender, no noted masses. No HSM

## 2021-10-29 ENCOUNTER — Other Ambulatory Visit: Payer: Self-pay | Admitting: Family Medicine

## 2021-11-23 ENCOUNTER — Encounter: Payer: Self-pay | Admitting: Family Medicine

## 2021-11-25 ENCOUNTER — Ambulatory Visit: Payer: BC Managed Care – PPO | Admitting: Family Medicine

## 2021-11-25 ENCOUNTER — Encounter: Payer: Self-pay | Admitting: Family Medicine

## 2021-11-25 VITALS — BP 130/86 | HR 93 | Temp 98.3°F | Ht 63.5 in | Wt 138.2 lb

## 2021-11-25 DIAGNOSIS — L081 Erythrasma: Secondary | ICD-10-CM | POA: Diagnosis not present

## 2021-11-25 MED ORDER — CLINDAMYCIN PHOSPHATE 1 % EX LOTN: TOPICAL_LOTION | Freq: Two times a day (BID) | CUTANEOUS | 0 refills | Status: DC

## 2021-11-25 NOTE — Patient Instructions (Signed)
Please follow up if symptoms do not improve or as needed.   

## 2021-11-25 NOTE — Progress Notes (Signed)
Subjective  CC:  Chief Complaint  Patient presents with   Rash    Pt has a rash on the inner crease of her groin area that has been there for the past 3 1/2 weeks. It is itchy and red. OTC antifungal powder which seems to help the itch but it's not going away.    HPI: Angelica Harris is a 35 y.o. female who presents to the office today to address the problems listed above in the chief complaint. 36 year old with rash groin weeks.  Using over-the-counter Monistat cream without resolution.  Not spreading.  Red.  No flaking.  No systemic symptoms.  Assessment  1. Erythrasma      Plan  Erythrasma: Suspect bacterial infection.  Topical clindamycin solution twice daily x1 to 2 weeks.  Follow-up if not improving.  Education given  Follow up:   01/06/2022  No orders of the defined types were placed in this encounter.  Meds ordered this encounter  Medications   clindamycin (CLEOCIN T) 1 % lotion    Sig: Apply topically 2 (two) times daily.    Dispense:  60 mL    Refill:  0      I reviewed the patients updated PMH, FH, and SocHx.    Patient Active Problem List   Diagnosis Date Noted   Prediabetes 12/01/2020    Priority: High   Subclinical hypothyroidism 07/17/2018    Priority: High   Essential hypertension 07/17/2018    Priority: High   Infertility associated with anovulation 07/17/2018    Priority: Medium    Cholelithiases - asymptomatic 07/17/2018    Priority: Medium    PCOS (polycystic ovarian syndrome)     Priority: Medium    Varicose veins of both lower extremities with pain 08/25/2020    Priority: Low   History of cervical incompetence 02/26/2021   History of severe pre-eclampsia 02/26/2021   History of cervical cerclage 02/26/2021   Current Meds  Medication Sig   clindamycin (CLEOCIN T) 1 % lotion Apply topically 2 (two) times daily.   levothyroxine (SYNTHROID) 25 MCG tablet TAKE 1 TABLET DAILY   metFORMIN (GLUCOPHAGE) 500 MG tablet TAKE 1 TABLET  TWICE A DAY WITH MEALS   Multiple Vitamin (MULTIVITAMIN WITH MINERALS) TABS tablet Take 1 tablet by mouth daily.   NIFEdipine (PROCARDIA XL/NIFEDICAL XL) 60 MG 24 hr tablet TAKE 1 TABLET DAILY    Allergies: Patient is allergic to labetalol hcl. Family History: Patient family history includes Diabetes in her father; Healthy in her daughter; Hypertension in her mother; Thyroid disease in her father. Social History:  Patient  reports that she has never smoked. She has never used smokeless tobacco. She reports that she does not drink alcohol and does not use drugs.  Review of Systems: Constitutional: Negative for fever malaise or anorexia Cardiovascular: negative for chest pain Respiratory: negative for SOB or persistent cough Gastrointestinal: negative for abdominal pain  Objective  Vitals: BP 130/86   Pulse 93   Temp 98.3 F (36.8 C)   Ht 5' 3.5" (1.613 m)   Wt 138 lb 3.2 oz (62.7 kg)   SpO2 98%   BMI 24.10 kg/m  General: no acute distress , A&Ox3  Skin:  Warm, left inguinal region with ovoid erythematous macular rash with sharp border.  No flaking    Commons side effects, risks, benefits, and alternatives for medications and treatment plan prescribed today were discussed, and the patient expressed understanding of the given instructions. Patient is instructed to call or  message via MyChart if he/she has any questions or concerns regarding our treatment plan. No barriers to understanding were identified. We discussed Red Flag symptoms and signs in detail. Patient expressed understanding regarding what to do in case of urgent or emergency type symptoms.  Medication list was reconciled, printed and provided to the patient in AVS. Patient instructions and summary information was reviewed with the patient as documented in the AVS. This note was prepared with assistance of Dragon voice recognition software. Occasional wrong-word or sound-a-like substitutions may have occurred due to the  inherent limitations of voice recognition software  This visit occurred during the SARS-CoV-2 public health emergency.  Safety protocols were in place, including screening questions prior to the visit, additional usage of staff PPE, and extensive cleaning of exam room while observing appropriate contact time as indicated for disinfecting solutions.

## 2022-01-06 ENCOUNTER — Ambulatory Visit: Payer: BC Managed Care – PPO | Admitting: Family Medicine

## 2022-02-20 ENCOUNTER — Other Ambulatory Visit: Payer: Self-pay

## 2022-02-20 ENCOUNTER — Ambulatory Visit (HOSPITAL_COMMUNITY)
Admission: EM | Admit: 2022-02-20 | Discharge: 2022-02-20 | Disposition: A | Discharge status: Home / Self Care | Payer: BC Managed Care – PPO | Attending: Family Medicine | Admitting: Family Medicine

## 2022-02-20 ENCOUNTER — Encounter (HOSPITAL_COMMUNITY): Payer: Self-pay | Admitting: Emergency Medicine

## 2022-02-20 DIAGNOSIS — U071 COVID-19: Secondary | ICD-10-CM | POA: Diagnosis not present

## 2022-02-20 DIAGNOSIS — R509 Fever, unspecified: Secondary | ICD-10-CM | POA: Diagnosis not present

## 2022-02-20 LAB — POC INFLUENZA A AND B ANTIGEN (URGENT CARE ONLY)
INFLUENZA A ANTIGEN, POC: NEGATIVE
INFLUENZA B ANTIGEN, POC: NEGATIVE

## 2022-02-20 MED ORDER — ACETAMINOPHEN 325 MG PO TABS
650.0000 mg | ORAL_TABLET | Freq: Once | ORAL | Status: AC
  Administered 2022-02-20: 650 mg via ORAL

## 2022-02-20 MED ORDER — ACETAMINOPHEN 325 MG PO TABS
ORAL_TABLET | ORAL | Status: AC
  Filled 2022-02-20: qty 2

## 2022-02-20 NOTE — Discharge Instructions (Addendum)
It was good to meet you today.  Your point-of-care flu test was negative.  You will be contacted regarding your COVID-19 results.  Please alternate Tylenol and ibuprofen at home.  Rest and push fluids.  Try to isolate at home as best as you can and mask around others.  Follow-up with your primary care as needed.  Present to the emergency department if any acutely worsening symptoms.

## 2022-02-20 NOTE — ED Triage Notes (Signed)
Tickle in throat, dry throat, cough, followed by body aches.  These complaints started yesterday evening.  Complains of chills, worsening of body aches, eyes and nose are getting dry.

## 2022-02-20 NOTE — ED Provider Notes (Signed)
[facility]   MRN: [id number] DOB: 28-Jul-1985  Subjective:   Natalya Domzalski is a 36 y.o. female presenting for fever, dry throat and nasal congestion, cough, body aches, chills.  She is accompanied by her husband.  She reports that she started feeling bad last night and was able to take Tylenol, which helped her to sleep.  She woke up feeling worse and says that she is hurting all over.  States that she even feels that heat in her eyes.  Denies any recent sick contacts.  She has a 70-year-old daughter, who has been well.  No recent travel.  No chest pain or shortness of breath.  No nausea, vomiting, diarrhea.  No abdominal pain.  She reports that she is currently on her menstrual cycle.  No current facility-administered medications for this encounter.  Current Outpatient Medications:    clindamycin (CLEOCIN T) 1 % lotion, Apply topically 2 (two) times daily., Disp: 60 mL, Rfl: 0   levothyroxine (SYNTHROID) 25 MCG tablet, TAKE 1 TABLET DAILY, Disp: 90 tablet, Rfl: 3   metFORMIN (GLUCOPHAGE) 500 MG tablet, TAKE 1 TABLET TWICE A DAY WITH MEALS, Disp: 180 tablet, Rfl: 3   Multiple Vitamin (MULTIVITAMIN WITH MINERALS) TABS tablet, Take 1 tablet by mouth daily., Disp: , Rfl:    NIFEdipine (PROCARDIA XL/NIFEDICAL XL) 60 MG 24 hr tablet, TAKE 1 TABLET DAILY, Disp: 180 tablet, Rfl: 3   Allergies  Allergen Reactions   Labetalol Hcl Other (See Comments)    Dizziness and fatigue    Past Medical History:  Diagnosis Date   Cervical incompetence    Cholelithiases - asymptomatic 07/17/2018   By ultrasound   Essential hypertension 07/17/2018   Hypertension    Infertility associated with anovulation    PCOS (polycystic ovarian syndrome)    Subclinical hypothyroidism 07/17/2018     Past Surgical History:  Procedure Laterality Date   CERVICAL CERCLAGE  2014   CESAREAN SECTION  2014    Family History  Problem Relation Age of Onset   Hypertension Mother     Diabetes Father    Thyroid disease Father    Healthy Daughter     Social History   Tobacco Use   Smoking status: Never   Smokeless tobacco: Never  Vaping Use   Vaping Use: Never used  Substance Use Topics   Alcohol use: Never   Drug use: Never    ROS REFER TO HPI FOR PERTINENT POSITIVES AND NEGATIVES   Objective:   Vitals: BP 131/80 (BP Location: Left Arm)   Pulse (!) 113   Temp (!) 102.1 F (38.9 C)   Resp (!) 22   LMP 01/23/2022   SpO2 97%   Physical Exam Vitals and nursing note reviewed.  Constitutional:      General: She is not in acute distress.    Appearance: Normal appearance. She is ill-appearing. She is not toxic-appearing.  HENT:     Head: Normocephalic.     Right Ear: Tympanic membrane, ear canal and external ear normal.     Left Ear: Tympanic membrane, ear canal and external ear normal.     Nose: Congestion present.     Mouth/Throat:     Mouth: Mucous membranes are moist.     Pharynx: No oropharyngeal exudate or posterior oropharyngeal erythema.  Eyes:     Extraocular Movements: Extraocular movements intact.     Conjunctiva/sclera: Conjunctivae normal.     Pupils: Pupils are equal, round, and reactive to light.  Cardiovascular:     Rate and Rhythm: Normal rate and regular rhythm.     Pulses: Normal pulses.     Heart sounds: Normal heart sounds. No murmur heard. Pulmonary:     Effort: Pulmonary effort is normal. No respiratory distress.     Breath sounds: Normal breath sounds. No wheezing.  Musculoskeletal:     Cervical back: Normal range of motion.  Skin:    General: Skin is warm.  Neurological:     Mental Status: She is alert and oriented to person, place, and time.  Psychiatric:        Mood and Affect: Mood normal.        Behavior: Behavior normal.     Results for orders placed or performed during the hospital encounter of 02/20/22 (from the past 24 hour(s))  POC Influenza A & B Ag (Urgent Care)     Status: None   Collection Time:  02/20/22  6:19 PM  Result Value Ref Range   INFLUENZA A ANTIGEN, POC NEGATIVE NEGATIVE   INFLUENZA B ANTIGEN, POC NEGATIVE NEGATIVE    Assessment and Plan :   PDMP not reviewed this encounter.  1. Fever, unspecified    36 year old female presented for flulike illness.  Reassured that point-of-care flu testing was negative.  High suspicion for COVID-19.  They did not have access to COVID testing at home, test was performed at urgent care tonight and they will be informed of results as these come back.  Discussed with the patient that even with a positive COVID-19 test, I would not recommend antiviral treatment at this time as she is not high risk.  She is going to treat conservatively at home with Tylenol and ibuprofen.  She will rest and push fluids.  She knows when to present to the emergency department if needed.  She will follow-up with her PCP if she is not seeing improvement next week.    AllwardtCrist Infante, PA-C 02/20/22 1900

## 2022-02-21 LAB — SARS CORONAVIRUS 2 (TAT 6-24 HRS): SARS Coronavirus 2: POSITIVE — AB

## 2022-02-22 ENCOUNTER — Encounter: Payer: Self-pay | Admitting: Family Medicine

## 2022-03-10 ENCOUNTER — Ambulatory Visit: Payer: BC Managed Care – PPO | Admitting: Family Medicine

## 2022-03-10 ENCOUNTER — Encounter: Payer: Self-pay | Admitting: Family Medicine

## 2022-03-10 VITALS — BP 120/90 | HR 86 | Temp 99.0°F | Ht 63.5 in | Wt 137.2 lb

## 2022-03-10 DIAGNOSIS — I10 Essential (primary) hypertension: Secondary | ICD-10-CM | POA: Diagnosis not present

## 2022-03-10 DIAGNOSIS — Z23 Encounter for immunization: Secondary | ICD-10-CM | POA: Diagnosis not present

## 2022-03-10 DIAGNOSIS — L659 Nonscarring hair loss, unspecified: Secondary | ICD-10-CM

## 2022-03-10 DIAGNOSIS — R7303 Prediabetes: Secondary | ICD-10-CM | POA: Diagnosis not present

## 2022-03-10 LAB — POCT GLYCOSYLATED HEMOGLOBIN (HGB A1C): Hemoglobin A1C: 5.9 % — AB (ref 4.0–5.6)

## 2022-03-10 MED ORDER — LISINOPRIL 10 MG PO TABS: 10.0000 mg | ORAL_TABLET | Freq: Every day | ORAL | 3 refills | Status: DC

## 2022-03-10 NOTE — Patient Instructions (Signed)
Please return in 3 months to recheck sugar test and blood pressure.   Stop the nifedipine and start the lisinopril daily.  Eat well, low salt and low sugar.  Rogaine can be used for hair thinning but you may want to wait 3 months to see if things are improving off the nifedipine.   If you have any questions or concerns, please don't hesitate to send me a message via MyChart or call the office at [phone]. Thank you for visiting with [place] today! It's our pleasure caring for you.

## 2022-03-10 NOTE — Progress Notes (Signed)
Subjective  CC:  Chief Complaint  Patient presents with   Hypertension   Diabetes    HPI: Zehava Harris is a 36 y.o. female who presents to the office today for follow up of diabetes and problems listed above in the chief complaint.  preDiabetes follow up: we  discussed dx: had a1c 6.6 max last year, on met 500 bid for PCOS mgt. Here for f/u. Eating well. Weight is stable.  HTN on nifedipine due to child bearing wants: no longer trying to get pregnant. Not on ace  C/o hair thinning x 1.5 years. No patchy loss or scalp concerns.  Wt Readings from Last 3 Encounters:  03/10/22 137 lb 3.2 oz (62.2 kg)  11/25/21 138 lb 3.2 oz (62.7 kg)  09/23/21 139 lb 12.8 oz (63.4 kg)    BP Readings from Last 3 Encounters:  03/10/22 (!) 120/90  02/20/22 131/80  11/25/21 130/86    Assessment  1. Prediabetes   2. Essential hypertension   3. Hair thinning   4. Need for immunization against influenza      Plan  Discussed dx: prediabetes vs diabetes: will continue met and diet and change to Ace inhibitor for HTN control and renal protection. Defer statin at this time.  Change to ace and stop CCB. Discussed SE, cough.  Hair thinning: suspect due to nifedipine. Will monitor.  Flu vaccine today  Follow up: 3 mo for bp recheck.. Orders Placed This Encounter  Procedures   Flu Vaccine QUAD 48moIM (Fluarix, Fluzone & Alfiuria Quad PF)   POCT HgB A1C   Meds ordered this encounter  Medications   lisinopril (ZESTRIL) 10 MG tablet    Sig: Take 1 tablet (10 mg total) by mouth daily.    Dispense:  90 tablet    Refill:  3      Immunization History  Administered Date(s) Administered   Influenza,inj,Quad PF,6+ Mos 03/23/2019, 03/10/2022   Influenza-Unspecified 05/03/2020   Moderna Sars-Covid-2 Vaccination 09/13/2019, 10/11/2019    Diabetes Related Lab Review: Lab Results  Component Value Date   HGBA1C 5.9 (A) 03/10/2022   HGBA1C 6.3 09/23/2021   HGBA1C 5.9 (A) 12/01/2020     No results found for: "MICROALBUR", "[id number]" Lab Results  Component Value Date   CREATININE 0.68 09/23/2021   BUN 13 09/23/2021   NA 136 09/23/2021   K 3.9 09/23/2021   CL 102 09/23/2021   CO2 25 09/23/2021   Lab Results  Component Value Date   CHOL 182 09/23/2021   CHOL 169 06/02/2020   CHOL 174 03/23/2019   Lab Results  Component Value Date   HDL 50.30 09/23/2021   HDL 46 (L) 06/02/2020   HDL 40.30 03/23/2019   Lab Results  Component Value Date   LDLCALC 93 09/23/2021   LDLCALC 100 (H) 06/02/2020   Lab Results  Component Value Date   TRIG 196.0 (H) 09/23/2021   TRIG 130 06/02/2020   TRIG 365.0 (H) 03/23/2019   Lab Results  Component Value Date   CHOLHDL 4 09/23/2021   CHOLHDL 3.7 06/02/2020   CHOLHDL 4 03/23/2019   Lab Results  Component Value Date   LDLDIRECT 115.0 03/23/2019   The ASCVD Risk score (Arnett DK, et al., 2019) failed to calculate for the following reasons:   The 2019 ASCVD risk score is only valid for ages 471to 719I have reviewed the [facility]. Patient Active Problem List   Diagnosis Date Noted   Prediabetes 12/01/2020  Priority: High   Subclinical hypothyroidism 07/17/2018    Priority: High   Essential hypertension 07/17/2018    Priority: High   Infertility associated with anovulation 07/17/2018    Priority: Medium    Cholelithiases - asymptomatic 07/17/2018    Priority: Medium     By ultrasound    PCOS (polycystic ovarian syndrome)     Priority: Medium    Varicose veins of both lower extremities with pain 08/25/2020    Priority: Low    Social History: Patient  reports that she has never smoked. She has never used smokeless tobacco. She reports that she does not drink alcohol and does not use drugs.  Review of Systems: Ophthalmic: negative for eye pain, loss of vision or double vision Cardiovascular: negative for chest pain Respiratory: negative for SOB or persistent cough Gastrointestinal: negative for  abdominal pain Genitourinary: negative for dysuria or gross hematuria MSK: negative for foot lesions Neurologic: negative for weakness or gait disturbance  Objective  Vitals: BP (!) 120/90   Pulse 86   Temp 99 F (37.2 C)   Ht 5' 3.5" (1.613 m)   Wt 137 lb 3.2 oz (62.2 kg)   LMP 01/23/2022   SpO2 99%   BMI 23.92 kg/m  General: well appearing, no acute distress  Psych:  Alert and oriented, normal mood and affect HEENT:  Normocephalic, atraumatic, moist mucous membranes, supple neck  Cardiovascular:  Nl S1 and S2, RRR without murmur, gallop or rub. no edema Respiratory:  Good breath sounds bilaterally, CTAB with normal effort, no rales     Diabetic education: ongoing education regarding chronic disease management for diabetes was given today. We continue to reinforce the ABC's of diabetic management: A1c (<7 or 8 dependent upon patient), tight blood pressure control, and cholesterol management with goal LDL < 100 minimally. We discuss diet strategies, exercise recommendations, medication options and possible side effects. At each visit, we review recommended immunizations and preventive care recommendations for diabetics and stress that good diabetic control can prevent other problems. See below for this patient's data.   Commons side effects, risks, benefits, and alternatives for medications and treatment plan prescribed today were discussed, and the patient expressed understanding of the given instructions. Patient is instructed to call or message via MyChart if he/she has any questions or concerns regarding our treatment plan. No barriers to understanding were identified. We discussed Red Flag symptoms and signs in detail. Patient expressed understanding regarding what to do in case of urgent or emergency type symptoms.  Medication list was reconciled, printed and provided to the patient in AVS. Patient instructions and summary information was reviewed with the patient as documented in  the AVS. This note was prepared with assistance of Dragon voice recognition software. Occasional wrong-word or sound-a-like substitutions may have occurred due to the inherent limitations of voice recognition software

## 2022-03-30 ENCOUNTER — Encounter: Payer: Self-pay | Admitting: Family Medicine

## 2022-04-01 ENCOUNTER — Ambulatory Visit: Payer: BC Managed Care – PPO | Admitting: Family Medicine

## 2022-04-01 ENCOUNTER — Encounter: Payer: Self-pay | Admitting: Family Medicine

## 2022-04-01 VITALS — BP 172/92 | HR 86 | Temp 98.7°F | Ht 63.5 in | Wt 137.2 lb

## 2022-04-01 DIAGNOSIS — R7303 Prediabetes: Secondary | ICD-10-CM | POA: Diagnosis not present

## 2022-04-01 DIAGNOSIS — T464X5A Adverse effect of angiotensin-converting-enzyme inhibitors, initial encounter: Secondary | ICD-10-CM | POA: Diagnosis not present

## 2022-04-01 DIAGNOSIS — R058 Other specified cough: Secondary | ICD-10-CM

## 2022-04-01 DIAGNOSIS — I10 Essential (primary) hypertension: Secondary | ICD-10-CM

## 2022-04-01 MED ORDER — TELMISARTAN-HCTZ 40-12.5 MG PO TABS: 1.0000 | ORAL_TABLET | Freq: Every day | ORAL | 2 refills | Status: DC

## 2022-04-01 MED ORDER — BENZONATATE 200 MG PO CAPS: 200.0000 mg | ORAL_CAPSULE | Freq: Two times a day (BID) | ORAL | 0 refills | Status: DC | PRN

## 2022-04-01 NOTE — Patient Instructions (Signed)
Please follow up as scheduled for your next visit with me: 06/09/2022 for blood pressure recheck  STOP the lisinopril. Start Micardis-hct tomorrow Take allergy pill and tessalon perles to help cough for now.   If you have any questions or concerns, please don't hesitate to send me a message via MyChart or call the office at [phone]. Thank you for visiting with [place] today! It's our pleasure caring for you.

## 2022-04-01 NOTE — Progress Notes (Signed)
Subjective  CC:  Chief Complaint  Patient presents with   Cough    Pt stated that she has been coughing for the past 2 weeks. Covid test was negative when taking last night     HPI: Angelica Harris is a 36 y.o. female who presents to the office today to address the problems listed above in the chief complaint. 36 year old female with prediabetes and hypertension, about 2 weeks ago switched from nifedipine to lisinopril for blood pressure control and renal protection.  Also.  She has had some hair thinning due to the calcium channel blocker.  Unfortunately, not tolerating well.  Since, has had a tickle in her throat and is having significant coughing symptoms with this coughing spasms. No symptoms of infection, specifically no sore throat, fevers, myalgias.  Otherwise feels well.  She does admit to some nasal congestion but that is mostly after coughing spasms.  She did start an allergy pill.  Robitussin has not been helpful. Hypertension: Very elevated today but this has been taken after coughing spell.  No chest pain or shortness of breath.  Assessment  1. ACE-inhibitor cough   2. Uncontrolled hypertension   3. Prediabetes      Plan  ACE inhibitor cough: Start ACE, will change to ARB with diuretic to better control blood pressure.  Education given.  May use Tessalon Perles to help until ACE is out of her system. Hypertension: Uncontrolled.  Start Micardis HCT 40/12.5 daily.  She will let me know if cough is not improved Prediabetic on metformin..  Would like to see if we can tolerate the ARB.  Follow up: As scheduled for blood pressure follow-up 06/09/2022  No orders of the defined types were placed in this encounter.  Meds ordered this encounter  Medications   telmisartan-hydrochlorothiazide (MICARDIS HCT) 40-12.5 MG tablet    Sig: Take 1 tablet by mouth daily.    Dispense:  30 tablet    Refill:  2   benzonatate (TESSALON) 200 MG capsule    Sig: Take 1 capsule  (200 mg total) by mouth 2 (two) times daily as needed for cough.    Dispense:  20 capsule    Refill:  0      I reviewed the patients updated PMH, FH, and SocHx.    Patient Active Problem List   Diagnosis Date Noted   Prediabetes 12/01/2020    Priority: High   Subclinical hypothyroidism 07/17/2018    Priority: High   Essential hypertension 07/17/2018    Priority: High   Infertility associated with anovulation 07/17/2018    Priority: Medium    Cholelithiases - asymptomatic 07/17/2018    Priority: Medium    PCOS (polycystic ovarian syndrome)     Priority: Medium    Varicose veins of both lower extremities with pain 08/25/2020    Priority: Low   Current Meds  Medication Sig   benzonatate (TESSALON) 200 MG capsule Take 1 capsule (200 mg total) by mouth 2 (two) times daily as needed for cough.   levothyroxine (SYNTHROID) 25 MCG tablet TAKE 1 TABLET DAILY   metFORMIN (GLUCOPHAGE) 500 MG tablet TAKE 1 TABLET TWICE A DAY WITH MEALS   Multiple Vitamin (MULTIVITAMIN WITH MINERALS) TABS tablet Take 1 tablet by mouth daily.   telmisartan-hydrochlorothiazide (MICARDIS HCT) 40-12.5 MG tablet Take 1 tablet by mouth daily.   [DISCONTINUED] lisinopril (ZESTRIL) 10 MG tablet Take 1 tablet (10 mg total) by mouth daily.    Allergies: Patient is allergic to ace inhibitors and  labetalol hcl. Family History: Patient family history includes Diabetes in her father; Healthy in her daughter; Hypertension in her mother; Thyroid disease in her father. Social History:  Patient  reports that she has never smoked. She has never used smokeless tobacco. She reports that she does not drink alcohol and does not use drugs.  Review of Systems: Constitutional: Negative for fever malaise or anorexia Cardiovascular: negative for chest pain Respiratory: negative for SOB or persistent cough Gastrointestinal: negative for abdominal pain  Objective  Vitals: BP (!) 172/92   Pulse 86   Temp 98.7 F (37.1 C)    Ht 5' 3.5" (1.613 m)   Wt 137 lb 3.2 oz (62.2 kg)   SpO2 98%   BMI 23.92 kg/m  General: no acute distress , A&Ox3, expanding cough present HEENT: PEERL, conjunctiva normal, neck is supple, clear oropharynx, mild nasal congestion, TMs normal, no cervical lymphadenopathy Cardiovascular:  RRR without murmur or gallop.   Respiratory:  Good breath sounds bilaterally, CTAB with normal respiratory effort, No rales, wheezing or rhonchi Skin:  Warm, no rashes    Commons side effects, risks, benefits, and alternatives for medications and treatment plan prescribed today were discussed, and the patient expressed understanding of the given instructions. Patient is instructed to call or message via MyChart if he/she has any questions or concerns regarding our treatment plan. No barriers to understanding were identified. We discussed Red Flag symptoms and signs in detail. Patient expressed understanding regarding what to do in case of urgent or emergency type symptoms.  Medication list was reconciled, printed and provided to the patient in AVS. Patient instructions and summary information was reviewed with the patient as documented in the AVS. This note was prepared with assistance of Dragon voice recognition software. Occasional wrong-word or sound-a-like substitutions may have occurred due to the inherent limitations of voice recognition software  This visit occurred during the SARS-CoV-2 public health emergency.  Safety protocols were in place, including screening questions prior to the visit, additional usage of staff PPE, and extensive cleaning of exam room while observing appropriate contact time as indicated for disinfecting solutions.

## 2022-06-09 ENCOUNTER — Ambulatory Visit: Payer: BC Managed Care – PPO | Admitting: Family Medicine

## 2022-06-14 ENCOUNTER — Ambulatory Visit: Payer: BC Managed Care – PPO | Admitting: Family Medicine

## 2022-06-14 ENCOUNTER — Encounter: Payer: Self-pay | Admitting: Family Medicine

## 2022-06-14 VITALS — BP 162/102 | HR 93 | Temp 98.7°F | Ht 63.5 in | Wt 139.4 lb

## 2022-06-14 DIAGNOSIS — L659 Nonscarring hair loss, unspecified: Secondary | ICD-10-CM

## 2022-06-14 DIAGNOSIS — R7303 Prediabetes: Secondary | ICD-10-CM

## 2022-06-14 DIAGNOSIS — E282 Polycystic ovarian syndrome: Secondary | ICD-10-CM

## 2022-06-14 DIAGNOSIS — I10 Essential (primary) hypertension: Secondary | ICD-10-CM | POA: Diagnosis not present

## 2022-06-14 MED ORDER — TELMISARTAN-HCTZ 80-25 MG PO TABS: 1.0000 | ORAL_TABLET | Freq: Every day | ORAL | 3 refills | Status: DC

## 2022-06-14 NOTE — Patient Instructions (Signed)
Please return in march for your complete physical, come fasting.   If you have any questions or concerns, please don't hesitate to send me a message via MyChart or call the office at [phone]. Thank you for visiting with [place] today! It's our pleasure caring for you.

## 2022-06-14 NOTE — Progress Notes (Signed)
Subjective  CC:  Chief Complaint  Patient presents with   Hypertension    Pt having hair loss    HPI: Angelica Harris is a 36 y.o. female who presents to the office today to address the problems listed above in the chief complaint. Hypertension f/u: ace cough resolved with stopping ace; now on arb/hctz and feels well. Was rushing to get here for appt: bp initially elevated: upon recheck after lying down:  Prediabetes: improved on metformin bid and tolerating.  Hair thinning: improving very slowly.  Assessment  1. Essential hypertension   2. Prediabetes   3. PCOS (polycystic ovarian syndrome)   4. Hair thinning      Plan   Hypertension f/u: BP control is poorly controlled. Double dose of telmisartan hct to 80/25 and start checking home readings.  Prediabetes: continue met 500 bid and recheck at cpe in march Hair thinning: due to meds we think; should resolve in time. Continue to monitor.   Education regarding management of these chronic disease states was given. Management strategies discussed on successive visits include dietary and exercise recommendations, goals of achieving and maintaining IBW, and lifestyle modifications aiming for adequate sleep and minimizing stressors.   Follow up: 3 mo for cpe and recheck  No orders of the defined types were placed in this encounter.  Meds ordered this encounter  Medications   telmisartan-hydrochlorothiazide (MICARDIS HCT) 80-25 MG tablet    Sig: Take 1 tablet by mouth daily.    Dispense:  90 tablet    Refill:  3      BP Readings from Last 3 Encounters:  06/14/22 (!) 162/102  04/01/22 (!) 172/92  03/10/22 (!) 120/90   Wt Readings from Last 3 Encounters:  06/14/22 139 lb 6.4 oz (63.2 kg)  04/01/22 137 lb 3.2 oz (62.2 kg)  03/10/22 137 lb 3.2 oz (62.2 kg)    Lab Results  Component Value Date   CHOL 182 09/23/2021   CHOL 169 06/02/2020   CHOL 174 03/23/2019   Lab Results  Component Value Date   HDL  50.30 09/23/2021   HDL 46 (L) 06/02/2020   HDL 40.30 03/23/2019   Lab Results  Component Value Date   LDLCALC 93 09/23/2021   LDLCALC 100 (H) 06/02/2020   Lab Results  Component Value Date   TRIG 196.0 (H) 09/23/2021   TRIG 130 06/02/2020   TRIG 365.0 (H) 03/23/2019   Lab Results  Component Value Date   CHOLHDL 4 09/23/2021   CHOLHDL 3.7 06/02/2020   CHOLHDL 4 03/23/2019   Lab Results  Component Value Date   LDLDIRECT 115.0 03/23/2019   Lab Results  Component Value Date   CREATININE 0.68 09/23/2021   BUN 13 09/23/2021   NA 136 09/23/2021   K 3.9 09/23/2021   CL 102 09/23/2021   CO2 25 09/23/2021    The ASCVD Risk score (Arnett DK, et al., 2019) failed to calculate for the following reasons:   The 2019 ASCVD risk score is only valid for ages 28 to 51  I reviewed the patients updated PMH, FH, and SocHx.    Patient Active Problem List   Diagnosis Date Noted   Prediabetes 12/01/2020    Priority: High   Subclinical hypothyroidism 07/17/2018    Priority: High   Essential hypertension 07/17/2018    Priority: High   Infertility associated with anovulation 07/17/2018    Priority: Medium    Cholelithiases - asymptomatic 07/17/2018    Priority: Medium  PCOS (polycystic ovarian syndrome)     Priority: Medium    Varicose veins of both lower extremities with pain 08/25/2020    Priority: Low    Allergies: Ace inhibitors and Labetalol hcl  Social History: Patient  reports that she has never smoked. She has never used smokeless tobacco. She reports that she does not drink alcohol and does not use drugs.  Current Meds  Medication Sig   levothyroxine (SYNTHROID) 25 MCG tablet TAKE 1 TABLET DAILY   metFORMIN (GLUCOPHAGE) 500 MG tablet TAKE 1 TABLET TWICE A DAY WITH MEALS   Multiple Vitamin (MULTIVITAMIN WITH MINERALS) TABS tablet Take 1 tablet by mouth daily.   telmisartan-hydrochlorothiazide (MICARDIS HCT) 80-25 MG tablet Take 1 tablet by mouth daily.    [DISCONTINUED] telmisartan-hydrochlorothiazide (MICARDIS HCT) 40-12.5 MG tablet Take 1 tablet by mouth daily.    Review of Systems: Cardiovascular: negative for chest pain, palpitations, leg swelling, orthopnea Respiratory: negative for SOB, wheezing or persistent cough Gastrointestinal: negative for abdominal pain Genitourinary: negative for dysuria or gross hematuria  Objective  Vitals: BP (!) 162/102   Pulse 93   Temp 98.7 F (37.1 C) (Temporal)   Ht 5' 3.5" (1.613 m)   Wt 139 lb 6.4 oz (63.2 kg)   LMP 05/17/2022 (Exact Date)   SpO2 97%   BMI 24.31 kg/m  General: no acute distress  Psych:  Alert and oriented, normal mood and affect HEENT:  Normocephalic, atraumatic, supple neck  Cardiovascular:  RRR without murmur. no edema Respiratory:  Good breath sounds bilaterally, CTAB with normal respiratory effort Skin:  Warm, no rashes Neurologic:   Mental status is normal Commons side effects, risks, benefits, and alternatives for medications and treatment plan prescribed today were discussed, and the patient expressed understanding of the given instructions. Patient is instructed to call or message via MyChart if he/she has any questions or concerns regarding our treatment plan. No barriers to understanding were identified. We discussed Red Flag symptoms and signs in detail. Patient expressed understanding regarding what to do in case of urgent or emergency type symptoms.  Medication list was reconciled, printed and provided to the patient in AVS. Patient instructions and summary information was reviewed with the patient as documented in the AVS. This note was prepared with assistance of Dragon voice recognition software. Occasional wrong-word or sound-a-like substitutions may have occurred due to the inherent limitation

## 2022-06-23 ENCOUNTER — Encounter: Payer: Self-pay | Admitting: Family Medicine

## 2022-06-25 ENCOUNTER — Emergency Department (HOSPITAL_BASED_OUTPATIENT_CLINIC_OR_DEPARTMENT_OTHER): Payer: BC Managed Care – PPO

## 2022-06-25 ENCOUNTER — Encounter (HOSPITAL_BASED_OUTPATIENT_CLINIC_OR_DEPARTMENT_OTHER): Payer: Self-pay | Admitting: Emergency Medicine

## 2022-06-25 ENCOUNTER — Telehealth: Payer: Self-pay | Admitting: Family Medicine

## 2022-06-25 ENCOUNTER — Other Ambulatory Visit: Payer: Self-pay

## 2022-06-25 ENCOUNTER — Emergency Department (HOSPITAL_BASED_OUTPATIENT_CLINIC_OR_DEPARTMENT_OTHER)
Admission: EM | Admit: 2022-06-25 | Discharge: 2022-06-25 | Disposition: A | Discharge status: Home / Self Care | Payer: BC Managed Care – PPO | Attending: Emergency Medicine | Admitting: Emergency Medicine

## 2022-06-25 DIAGNOSIS — R079 Chest pain, unspecified: Secondary | ICD-10-CM

## 2022-06-25 DIAGNOSIS — I10 Essential (primary) hypertension: Secondary | ICD-10-CM | POA: Insufficient documentation

## 2022-06-25 DIAGNOSIS — M25511 Pain in right shoulder: Secondary | ICD-10-CM | POA: Insufficient documentation

## 2022-06-25 DIAGNOSIS — R519 Headache, unspecified: Secondary | ICD-10-CM | POA: Diagnosis not present

## 2022-06-25 LAB — CBC
HCT: 39.3 % (ref 36.0–46.0)
Hemoglobin: 13.4 g/dL (ref 12.0–15.0)
MCH: 29.6 pg (ref 26.0–34.0)
MCHC: 34.1 g/dL (ref 30.0–36.0)
MCV: 86.9 fL (ref 80.0–100.0)
Platelets: 311 10*3/uL (ref 150–400)
RBC: 4.52 MIL/uL (ref 3.87–5.11)
RDW: 12.3 % (ref 11.5–15.5)
WBC: 7.2 10*3/uL (ref 4.0–10.5)
nRBC: 0 % (ref 0.0–0.2)

## 2022-06-25 LAB — BASIC METABOLIC PANEL
Anion gap: 11 (ref 5–15)
BUN: 14 mg/dL (ref 6–20)
CO2: 29 mmol/L (ref 22–32)
Calcium: 10.9 mg/dL — ABNORMAL HIGH (ref 8.9–10.3)
Chloride: 97 mmol/L — ABNORMAL LOW (ref 98–111)
Creatinine, Ser: 0.74 mg/dL (ref 0.44–1.00)
GFR, Estimated: >60 mL/min (ref >60)
Glucose, Bld: 96 mg/dL (ref 70–99)
Potassium: 3.5 mmol/L (ref 3.5–5.1)
Sodium: 137 mmol/L (ref 135–145)

## 2022-06-25 LAB — TROPONIN I (HIGH SENSITIVITY): Troponin I (High Sensitivity): 2 ng/L (ref <18)

## 2022-06-25 MED ORDER — ACETAMINOPHEN 325 MG PO TABS
650.0000 mg | ORAL_TABLET | Freq: Once | ORAL | Status: AC
  Administered 2022-06-25: 650 mg via ORAL
  Filled 2022-06-25: qty 2

## 2022-06-25 NOTE — ED Notes (Signed)
Pt aware urine specimen ordered. Pt reports inability to provide specimen at this time. Specimen collection device provided to patient. 

## 2022-06-25 NOTE — ED Provider Notes (Signed)
[facility] EMERGENCY DEPT Provider Note   CSN: [id number] Arrival date & time: 06/25/22  1059     History  Chief Complaint  Patient presents with   Hypertension    Angelica Harris is a 36 y.o. female.  She has a past medical history of hypertension, PCOS and prediabetes.  Presents the ED for high blood pressure.  Is been going on for about 2 weeks.  She saw her PCP who doubled her myocarditis but patient states this has not changed her blood pressure values though she does admit to missing a couple doses over the past couple of weeks.  She comes in today because her blood pressure has not changed and she felt some heaviness in her chest, no severe headache, no shortness of breath, no numbness tingling or weakness.  No blurry vision.  Does report some left shoulder pain as well but states that the heaviness of her chest is improving since arriving here.   Hypertension       Home Medications Prior to Admission medications   Medication Sig Start Date End Date Taking? Authorizing Provider  levothyroxine (SYNTHROID) 25 MCG tablet TAKE 1 TABLET DAILY 10/29/21   Willow Ora, MD  metFORMIN (GLUCOPHAGE) 500 MG tablet TAKE 1 TABLET TWICE A DAY WITH MEALS 07/02/21   Willow Ora, MD  Multiple Vitamin (MULTIVITAMIN WITH MINERALS) TABS tablet Take 1 tablet by mouth daily.    [provider]  telmisartan-hydrochlorothiazide (MICARDIS HCT) 80-25 MG tablet Take 1 tablet by mouth daily. 06/14/22   Willow Ora, MD      Allergies    Ace inhibitors and Labetalol hcl    Review of Systems   Review of Systems  Cardiovascular:        "Heaviness" in the chest    Physical Exam Updated Vital Signs BP (!) 150/98 (BP Location: Right Arm)   Pulse 82   Temp 98.5 F (36.9 C) (Oral)   Resp 16   Ht 5' 3.5" (1.613 m)   Wt 63 kg   LMP 05/17/2022 (Exact Date)   SpO2 100%   BMI 24.22 kg/m  Physical Exam Vitals and nursing note reviewed.  Constitutional:       General: She is not in acute distress.    Appearance: She is well-developed.  HENT:     Head: Normocephalic and atraumatic.  Eyes:     Conjunctiva/sclera: Conjunctivae normal.  Cardiovascular:     Rate and Rhythm: Normal rate and regular rhythm.     Heart sounds: No murmur heard. Pulmonary:     Effort: Pulmonary effort is normal. No respiratory distress.     Breath sounds: Normal breath sounds.  Abdominal:     Palpations: Abdomen is soft.     Tenderness: There is no abdominal tenderness.  Musculoskeletal:        General: No swelling.     Cervical back: Neck supple.  Skin:    General: Skin is warm and dry.     Capillary Refill: Capillary refill takes less than 2 seconds.  Neurological:     Mental Status: She is alert.  Psychiatric:        Mood and Affect: Mood normal.     ED Results / Procedures / Treatments   Labs (all labs ordered are listed, but only abnormal results are displayed) Labs Reviewed  BASIC METABOLIC PANEL - Abnormal; Notable for the following components:      Result Value   Chloride 97 (*)  Calcium 10.9 (*)    All other components within normal limits  CBC  PREGNANCY, URINE  TROPONIN I (HIGH SENSITIVITY)    EKG None  Radiology DG Chest Portable 1 View  Result Date: 06/25/2022 CLINICAL DATA:  Hypertension.  Headache. EXAM: PORTABLE CHEST 1 VIEW COMPARISON:  None Available. FINDINGS: The heart size and mediastinal contours are within normal limits. Both lungs are clear. No visible pleural effusions or pneumothorax. No acute osseous abnormality. IMPRESSION: No active disease. Electronically Signed   By: Feliberto Harts M.D.   On: 06/25/2022 12:34    Procedures Procedures    Medications Ordered in ED Medications  acetaminophen (TYLENOL) tablet 650 mg (650 mg Oral Given 06/25/22 1231)    ED Course/ Medical Decision Making/ A&P                           Medical Decision Making This patient presents to the ED for concern of blood  pressure with heaviness in the chest, this involves an extensive number of treatment options, and is a complaint that carries with it a high risk of complications and morbidity.  The differential diagnosis includes hypertensive emergency, ACS, coronary artery dissection, central hypertension, pneumothorax, aortic dissection anxiety, other   Co morbidities that complicate the patient evaluation  Prediabetes, hypertension, high cholesterol   Additional history obtained:  Additional history obtained from EMR External records from outside source obtained and reviewed including Prior outpatient visit for HTN   Lab Tests:  I Ordered, and personally interpreted labs.  The pertinent results include: CBC, BMP and troponin, all reassuring particularly the troponin is only 2   Imaging Studies ordered:  I ordered imaging studies including chest x-ray I independently visualized and interpreted imaging which showed no pulmonary edema or pneumonia, normal mediastinum  I agree with the radiologist interpretation   Cardiac Monitoring: / EKG:  The patient was maintained on a cardiac monitor.  I personally viewed and interpreted the cardiac monitored which showed an underlying rhythm of: Sinus rhythm     Problem List / ED Course / Critical interventions / Medication management  Hypertension with heaviness in the chest-patient has slightly elevated blood pressure came in today severely because she is having stiffness in the chest.  PCP recently adjusted her medications.  She is well-appearing, pain was already resolving while in the ED.  Is been going on all morning, patient's score is only 1.  Single troponin is 2.  ECG x 2 shows likely early repolarization, no ischemic changes.  Discussed with patient these findings and need to follow-up with her primary care doctor, advised to come back to the ER for new or worsening symptoms. I ordered medication including the acetaminophen for  discomfort Reevaluation of the patient after these medicines showed that the patient improved I have reviewed the patients home medicines and have made adjustments as needed  She also noted that she is a small wound on the right index finger at the DIP joint that she states she keeps reinjuring.  She has a small scab in this area with mild tenderness, there is no cellulitis.  Normal range of motion of the finger.  She reports over time the is nearly healed she hits it off of something and it scabs over again.  Discussed with her she can follow-up with her PCP regarding this, there is no need for antibiotics.  Advised she keep it covered with antibiotic ointment and a Band-Aid    Test /  Admission - Considered:  Considered D-dimer though patient is not tachycardic, short of breath, no hemoptysis, no leg pain or swelling, pain is not pleuritic.  I have extremely low suspicion for PE, do not feel further workup is indicated    Amount and/or Complexity of Data Reviewed Labs: ordered. Radiology: ordered.  Risk OTC drugs.          Final Clinical Impression(s) / ED Diagnoses Final diagnoses:  Hypertension, unspecified type  Chest pain, unspecified type    Rx / DC Orders ED Discharge Orders     None         Josem Kaufmann 06/25/22 1501    Benjiman Core, MD 06/25/22 [phone]

## 2022-06-25 NOTE — Telephone Encounter (Signed)
Patient states her blood pressure was 156/120 last night (06/24/22).  Patient states today her head and her heart feel heavy (pressure).   Patient states she is unable to check her blood pressure today because she is at work.  Transferred to Triage Martie Lee).

## 2022-06-25 NOTE — ED Triage Notes (Signed)
Pt arrives to ED with c/o HTN. She notes BP yesterday that was 158 systolic. She notes headache.

## 2022-06-25 NOTE — Discharge Instructions (Addendum)
Seen today for elevated blood pressure with some heaviness in your chest.  The tests for your heart were normal.  Your blood pressure is improved.  Follow-up with your primary care doctor to see if they want to adjust your medications further.  Come back to the ER if you have any new or worsening symptoms.

## 2022-06-29 ENCOUNTER — Ambulatory Visit: Payer: BC Managed Care – PPO | Admitting: Family

## 2022-06-29 ENCOUNTER — Telehealth: Payer: Self-pay | Admitting: Family Medicine

## 2022-06-29 NOTE — Telephone Encounter (Signed)
Patient was asked to call EMS 911   Patient Name: Angelica Harris AM Gender: Female DOB: 1986/05/13 Age: 37 Y 3 M 5 D Return Phone Number: [phone] (Primary), [phone] (Secondary) Address: City/ State/ Zip: [place]  [address] Client [place] [facility] [place] [facility] [place] [facility] Provider Billey Chang- MD Contact Type Call Who Is Calling Patient / Member / Family / Caregiver Call Type Triage / Clinical Relationship To Patient Self Return Phone Number [phone] (Primary) Chief Complaint CHEST PAIN - pain, pressure, heaviness or tightness Reason for Call Symptomatic / Request for [place] states that her blood pressure was 156/120 last night. This morning she has heaviness in her head and chest. [facility]- [address] Translation No Nurse Assessment Nurse: Patsey Berthold, RN, Roma Kayser Date/Time Eilene Ghazi Time): 06/25/2022 10:06:01 AM Confirm and document reason for call. If symptomatic, describe symptoms. ---Caller states that her blood pressure was 156/120 last night. This morning she has heaviness in her head and chest. Currently at work, unable to review BP. Feeling very emotional, nausea, without emesis. Onset yesterday night. Does the patient have any new or worsening symptoms? ---Yes Will a triage be completed? ---Yes Related visit to physician within the last 2 weeks? ---No Does the PT have any chronic conditions? (i.e. diabetes, asthma, this includes High risk factors for pregnancy, etc.) ---Yes List chronic conditions. ---HTN, pre-DM Is the patient pregnant or possibly pregnant? (Ask all females between the ages of 33-55) ---No Is this a behavioral health or substance abuse call? ---No PLEASE NOTE: All timestamps contained within this report are represented as [place] Standard Time. CONFIDENTIALTY NOTICE: This fax  transmission is intended only for the addressee. It contains information that is legally privileged, confidential or otherwise protected from use or disclosure. If you are not the intended recipient, you are strictly prohibited from reviewing, disclosing, copying using or disseminating any of this information or taking any action in reliance on or regarding this information. If you have received this fax in error, please notify us immediately by telephone so that we can arrange for its return to [place]. Phone: [phone], Toll-Free: [phone], Fax: [phone] Page: 2 of 2 Call Id: [id number] Guidelines Guideline Title Affirmed Question Affirmed Notes Nurse Date/Time Eilene Ghazi Time) Chest Pain [1] Chest pain lasts > 5 minutes AND [2] age > 30 AND [3] one or more cardiac risk factors (e.g., diabetes, high blood pressure, high cholesterol, smoker, or strong family history of heart disease) Vivi Ferns 06/25/2022 10:07:45 AM Disp. Time Eilene Ghazi Time) Disposition Final User 06/25/2022 10:04:00 AM Send to Urgent Ala Dach 06/25/2022 10:11:59 AM Call EMS 911 Now Yes Patsey Berthold, RN, Roma Kayser 06/25/2022 10:12:30 AM 911 Outcome Documentation Patsey Berthold, RN, Roma Kayser Reason: Caller refused to call 911, will drive herself to ED. Final Disposition 06/25/2022 10:11:59 AM Call EMS 911 Now Yes Patsey Berthold, RN, Melvyn Novas Disagree/Comply Disagree Caller Understands Yes PreDisposition Call Doctor Care Advice Given Per Guideline CALL EMS 911 NOW: * Immediate medical attention is needed. You need to hang up and call 911 (or an ambulance). * Triager Discretion: I'll call you back in a few minutes to be sure you were able to reach them. CARE ADVICE given per Chest Pain (Adult) guideline. Referrals GO TO FACILITY OTHER - [facility] - ED

## 2022-06-30 ENCOUNTER — Encounter: Payer: Self-pay | Admitting: Family Medicine

## 2022-07-05 ENCOUNTER — Ambulatory Visit: Payer: BC Managed Care – PPO | Admitting: Family Medicine

## 2022-07-05 ENCOUNTER — Encounter: Payer: Self-pay | Admitting: Family Medicine

## 2022-07-05 VITALS — BP 124/80 | HR 109 | Temp 98.6°F | Ht 63.5 in | Wt 142.2 lb

## 2022-07-05 DIAGNOSIS — I10 Essential (primary) hypertension: Secondary | ICD-10-CM | POA: Diagnosis not present

## 2022-07-05 DIAGNOSIS — F430 Acute stress reaction: Secondary | ICD-10-CM

## 2022-07-05 NOTE — Progress Notes (Signed)
Subjective  CC:  Chief Complaint  Patient presents with   Hospitalization Follow-up   Hypertension   Chest Pain    HPI: Angelica Harris is a 37 y.o. female who presents to the office today to address the problems listed above in the chief complaint. Hypertension f/u: Reviewed her recent visits from October and December.  Had ACE inhibitor cough was changed to ARB HCTZ combination.  In December her blood pressure was still elevated so we doubled the dose.  However, her blood pressure remained elevated and it was associated with some vague chest heaviness.  She did have emergency room evaluation.  I reviewed the workup.  Evaluation was negative for acute coronary syndrome.  Patient does admit that she was overly stressed during the holidays, also had some emotional stressors due to history of pregnancy loss in the past.  She reports she was working overtime well also managing how the day festivities etc.  She became quite irritable.  Did have some headaches.  However, over the last week or so, she has felt back to her normal self.  Spent time relaxing.  Blood pressures have normalized.  No longer having chest pain.  She remains on telmisartan HCTZ 80/25 daily. No visits with results within 1 Day(s) from this visit.  Latest known visit with results is:  Admission on 06/25/2022, Discharged on 06/25/2022  Component Date Value Ref Range Status   Sodium 06/25/2022 137  135 - 145 mmol/L Final   Potassium 06/25/2022 3.5  3.5 - 5.1 mmol/L Final   Chloride 06/25/2022 97 (L)  98 - 111 mmol/L Final   CO2 06/25/2022 29  22 - 32 mmol/L Final   Glucose, Bld 06/25/2022 96  70 - 99 mg/dL Final   BUN 06/25/2022 14  6 - 20 mg/dL Final   Creatinine, Ser 06/25/2022 0.74  0.44 - 1.00 mg/dL Final   Calcium 06/25/2022 10.9 (H)  8.9 - 10.3 mg/dL Final   GFR, Estimated 06/25/2022 >60  >60 mL/min Final   Anion gap 06/25/2022 11  5 - 15 Final   Troponin I (High Sensitivity) 06/25/2022 2  <18 ng/L Final    WBC 06/25/2022 7.2  4.0 - 10.5 K/uL Final   RBC 06/25/2022 4.52  3.87 - 5.11 MIL/uL Final   Hemoglobin 06/25/2022 13.4  12.0 - 15.0 g/dL Final   HCT 06/25/2022 39.3  36.0 - 46.0 % Final   MCV 06/25/2022 86.9  80.0 - 100.0 fL Final   MCH 06/25/2022 29.6  26.0 - 34.0 pg Final   MCHC 06/25/2022 34.1  30.0 - 36.0 g/dL Final   RDW 06/25/2022 12.3  11.5 - 15.5 % Final   Platelets 06/25/2022 311  150 - 400 K/uL Final   nRBC 06/25/2022 0.0  0.0 - 0.2 % Final    Assessment  1. Essential hypertension   2. Stress reaction      Plan   Hypertension f/u: BP control is fairly well controlled.  Will continue current medication and monitor.  She has follow-up in a few months.  I can adjust medications at that time if remains above goal.  Reassured. Stress reaction: Patient inquired about psychotherapy.  Referred to behavioral health.  I do believe this did play a role in her symptomatology.  Education regarding management of these chronic disease states was given. Management strategies discussed on successive visits include dietary and exercise recommendations, goals of achieving and maintaining IBW, and lifestyle modifications aiming for adequate sleep and minimizing stressors.   Follow  up: as scheduled  No orders of the defined types were placed in this encounter.  No orders of the defined types were placed in this encounter.     BP Readings from Last 3 Encounters:  07/05/22 124/80  06/25/22 (!) 150/98  06/14/22 (!) 162/102   Wt Readings from Last 3 Encounters:  07/05/22 142 lb 3.2 oz (64.5 kg)  06/25/22 138 lb 14.2 oz (63 kg)  06/14/22 139 lb 6.4 oz (63.2 kg)    Lab Results  Component Value Date   CHOL 182 09/23/2021   CHOL 169 06/02/2020   CHOL 174 03/23/2019   Lab Results  Component Value Date   HDL 50.30 09/23/2021   HDL 46 (L) 06/02/2020   HDL 40.30 03/23/2019   Lab Results  Component Value Date   LDLCALC 93 09/23/2021   LDLCALC 100 (H) 06/02/2020   Lab Results   Component Value Date   TRIG 196.0 (H) 09/23/2021   TRIG 130 06/02/2020   TRIG 365.0 (H) 03/23/2019   Lab Results  Component Value Date   CHOLHDL 4 09/23/2021   CHOLHDL 3.7 06/02/2020   CHOLHDL 4 03/23/2019   Lab Results  Component Value Date   LDLDIRECT 115.0 03/23/2019   Lab Results  Component Value Date   CREATININE 0.74 06/25/2022   BUN 14 06/25/2022   NA 137 06/25/2022   K 3.5 06/25/2022   CL 97 (L) 06/25/2022   CO2 29 06/25/2022    The ASCVD Risk score (Arnett DK, et al., 2019) failed to calculate for the following reasons:   The 2019 ASCVD risk score is only valid for ages 110 to 58  I reviewed the patients updated PMH, FH, and SocHx.    Patient Active Problem List   Diagnosis Date Noted   Prediabetes 12/01/2020    Priority: High   Subclinical hypothyroidism 07/17/2018    Priority: High   Essential hypertension 07/17/2018    Priority: High   Infertility associated with anovulation 07/17/2018    Priority: Medium    Cholelithiases - asymptomatic 07/17/2018    Priority: Medium    PCOS (polycystic ovarian syndrome)     Priority: Medium    Varicose veins of both lower extremities with pain 08/25/2020    Priority: Low    Allergies: Ace inhibitors and Labetalol hcl  Social History: Patient  reports that she has never smoked. She has never used smokeless tobacco. She reports that she does not drink alcohol and does not use drugs.  Current Meds  Medication Sig   levothyroxine (SYNTHROID) 25 MCG tablet TAKE 1 TABLET DAILY   metFORMIN (GLUCOPHAGE) 500 MG tablet TAKE 1 TABLET TWICE A DAY WITH MEALS   Multiple Vitamin (MULTIVITAMIN WITH MINERALS) TABS tablet Take 1 tablet by mouth daily.   telmisartan-hydrochlorothiazide (MICARDIS HCT) 80-25 MG tablet Take 1 tablet by mouth daily.    Review of Systems: Cardiovascular: negative for chest pain, palpitations, leg swelling, orthopnea Respiratory: negative for SOB, wheezing or persistent cough Gastrointestinal:  negative for abdominal pain Genitourinary: negative for dysuria or gross hematuria  Objective  Vitals: BP 124/80   Pulse (!) 109   Temp 98.6 F (37 C)   Ht 5' 3.5" (1.613 m)   Wt 142 lb 3.2 oz (64.5 kg)   LMP 05/17/2022 (Exact Date)   SpO2 97%   BMI 24.79 kg/m  General: no acute distress  Psych:  Alert and oriented, normal mood and affect Neurologic:   Mental status is normal Commons side effects, risks, benefits, and  alternatives for medications and treatment plan prescribed today were discussed, and the patient expressed understanding of the given instructions. Patient is instructed to call or message via MyChart if he/she has any questions or concerns regarding our treatment plan. No barriers to understanding were identified. We discussed Red Flag symptoms and signs in detail. Patient expressed understanding regarding what to do in case of urgent or emergency type symptoms.  Medication list was reconciled, printed and provided to the patient in AVS. Patient instructions and summary information was reviewed with the patient as documented in the AVS. This note was prepared with assistance of Dragon voice recognition software. Occasional wrong-word or sound-a-like substitutions may have occurred due to the inherent limitation

## 2022-07-05 NOTE — Patient Instructions (Addendum)
Please follow up as scheduled for your next visit with me: 10/11/2022   If you have any questions or concerns, please don't hesitate to send me a message via MyChart or call the office at [phone]. Thank you for visiting with [place] today! It's our pleasure caring for you.   Please call [facility] to schedule an appointment with Dr. Theodis Shove; she is a therapist here at our [place] office.  The phone number is: [phone]

## 2022-07-06 ENCOUNTER — Other Ambulatory Visit: Payer: Self-pay | Admitting: Family Medicine

## 2022-07-15 ENCOUNTER — Encounter: Payer: Self-pay | Admitting: Family Medicine

## 2022-07-27 ENCOUNTER — Encounter: Payer: Self-pay | Admitting: Family Medicine

## 2022-09-06 ENCOUNTER — Encounter: Payer: Self-pay | Admitting: Family Medicine

## 2022-10-11 ENCOUNTER — Encounter: Payer: BC Managed Care – PPO | Admitting: Family Medicine

## 2022-11-09 ENCOUNTER — Encounter: Payer: Self-pay | Admitting: Family Medicine

## 2022-11-09 ENCOUNTER — Ambulatory Visit (INDEPENDENT_AMBULATORY_CARE_PROVIDER_SITE_OTHER): Payer: BC Managed Care – PPO | Admitting: Family Medicine

## 2022-11-09 VITALS — BP 138/88 | HR 86 | Temp 98.7°F | Ht 63.5 in | Wt 141.0 lb

## 2022-11-09 DIAGNOSIS — E282 Polycystic ovarian syndrome: Secondary | ICD-10-CM

## 2022-11-09 DIAGNOSIS — E038 Other specified hypothyroidism: Secondary | ICD-10-CM

## 2022-11-09 DIAGNOSIS — Z23 Encounter for immunization: Secondary | ICD-10-CM

## 2022-11-09 DIAGNOSIS — I10 Essential (primary) hypertension: Secondary | ICD-10-CM

## 2022-11-09 DIAGNOSIS — Z0000 Encounter for general adult medical examination without abnormal findings: Secondary | ICD-10-CM

## 2022-11-09 DIAGNOSIS — R7303 Prediabetes: Secondary | ICD-10-CM | POA: Diagnosis not present

## 2022-11-09 DIAGNOSIS — K8020 Calculus of gallbladder without cholecystitis without obstruction: Secondary | ICD-10-CM

## 2022-11-09 LAB — LIPID PANEL
Cholesterol: 171 mg/dL (ref 0–200)
HDL: 47.6 mg/dL (ref >39.00)
LDL Cholesterol: 88 mg/dL (ref 0–99)
NonHDL: 123.1
Total CHOL/HDL Ratio: 4
Triglycerides: 177 mg/dL — ABNORMAL HIGH (ref 0.0–149.0)
VLDL: 35.4 mg/dL (ref 0.0–40.0)

## 2022-11-09 LAB — TSH: TSH: 1.84 u[IU]/mL (ref 0.35–5.50)

## 2022-11-09 LAB — CBC WITH DIFFERENTIAL/PLATELET
Basophils Absolute: 0 10*3/uL (ref 0.0–0.1)
Basophils Relative: 0.7 % (ref 0.0–3.0)
Eosinophils Absolute: 0.1 10*3/uL (ref 0.0–0.7)
Eosinophils Relative: 2.1 % (ref 0.0–5.0)
HCT: 36.7 % (ref 36.0–46.0)
Hemoglobin: 12.4 g/dL (ref 12.0–15.0)
Lymphocytes Relative: 24.6 % (ref 12.0–46.0)
Lymphs Abs: 1.5 10*3/uL (ref 0.7–4.0)
MCHC: 33.8 g/dL (ref 30.0–36.0)
MCV: 89 fl (ref 78.0–100.0)
Monocytes Absolute: 0.4 10*3/uL (ref 0.1–1.0)
Monocytes Relative: 6.7 % (ref 3.0–12.0)
Neutro Abs: 4.1 10*3/uL (ref 1.4–7.7)
Neutrophils Relative %: 65.9 % (ref 43.0–77.0)
Platelets: 290 10*3/uL (ref 150.0–400.0)
RBC: 4.12 Mil/uL (ref 3.87–5.11)
RDW: 12.9 % (ref 11.5–15.5)
WBC: 6.1 10*3/uL (ref 4.0–10.5)

## 2022-11-09 LAB — COMPREHENSIVE METABOLIC PANEL
ALT: 13 U/L (ref 0–35)
AST: 15 U/L (ref 0–37)
Albumin: 4.3 g/dL (ref 3.5–5.2)
Alkaline Phosphatase: 35 U/L — ABNORMAL LOW (ref 39–117)
BUN: 8 mg/dL (ref 6–23)
CO2: 27 mEq/L (ref 19–32)
Calcium: 9.3 mg/dL (ref 8.4–10.5)
Chloride: 102 mEq/L (ref 96–112)
Creatinine, Ser: 0.73 mg/dL (ref 0.40–1.20)
GFR: 104.17 mL/min (ref >60.00)
Glucose, Bld: 109 mg/dL — ABNORMAL HIGH (ref 70–99)
Potassium: 3.7 mEq/L (ref 3.5–5.1)
Sodium: 138 mEq/L (ref 135–145)
Total Bilirubin: 0.5 mg/dL (ref 0.2–1.2)
Total Protein: 7 g/dL (ref 6.0–8.3)

## 2022-11-09 LAB — HEMOGLOBIN A1C: Hgb A1c MFr Bld: 6.4 % (ref 4.6–6.5)

## 2022-11-09 MED ORDER — AMLODIPINE BESYLATE 5 MG PO TABS: 5.0000 mg | ORAL_TABLET | Freq: Every day | ORAL | 3 refills | Status: DC

## 2022-11-09 NOTE — Patient Instructions (Signed)
Please return in 3 months for hypertension recheck   Please take amlodipine along with your micardis hct daily.   Today you were given your Tdap vaccination.   I will release your lab results to you on your MyChart account with further instructions. You may see the results before I do, but when I review them I will send you a message with my report or have my assistant call you if things need to be discussed. Please reply to my message with any questions. Thank you!   If you have any questions or concerns, please don't hesitate to send me a message via MyChart or call the office at [phone]. Thank you for visiting with Angelica Harris today! It's our pleasure caring for you.

## 2022-11-09 NOTE — Progress Notes (Signed)
Subjective  Chief Complaint  Patient presents with   Annual Exam    Pt here for Annual exam and is currently fasting     HPI: Angelica Harris is a 37 y.o. female who presents to [facility] [place] today for a Female Wellness Visit. She also has the concerns and/or needs as listed above in the chief complaint. These will be addressed in addition to the Health Maintenance Visit.   Wellness Visit: annual visit with health maintenance review and exam without Pap  Health maintenance: Pap smear up-to-date.  Eye exam current.  Eligible for Tdap booster today.  Feeling very well.  Home life and work life have stabilized.  Managing normal stressors.  No new concerns. Chronic disease f/u and/or acute problem visit: (deemed necessary to be done in addition to the wellness visit): Hypertension: Remains only fairly well controlled.  Takes telmisartan HCT 80/25  daily without adverse effects.  She has an ACE inhibitor cough.  She has used nifedipine in the past which caused hair thinning.  Has also been on amlodipine which she says she tolerated.  Was on labetalol which she felt fatigued and dizzy in the past.  She was switched off amlodipine and change to nifedipine when she was trying to conceive.  No chest pain shortness of breath or lower extremity swelling Subclinical hypothyroidism: Reports good energy.  Takes levothyroxine 25 mcg daily.  No concerns there.  Due for recheck History of prediabetes and PCOS on metformin 500 twice daily and tolerating well.  Regular menstrual cycles.  No symptoms of hypoglycemia.  Healthy diet No biliary colic  Assessment  1. Annual physical exam   2. Need for Tdap vaccination   3. Essential hypertension   4. Subclinical hypothyroidism   5. Prediabetes   6. PCOS (polycystic ovarian syndrome)   7. Calculus of gallbladder without cholecystitis without obstruction      Plan  Female Wellness Visit: Age appropriate Health  Maintenance and Prevention measures were discussed with patient. Included topics are cancer screening recommendations, ways to keep healthy (see AVS) including dietary and exercise recommendations, regular eye and dental care, use of seat belts, and avoidance of moderate alcohol use and tobacco use.  Screens are current BMI: discussed patient's BMI and encouraged positive lifestyle modifications to help get to or maintain a target BMI. HM needs and immunizations were addressed and ordered. See below for orders. See HM and immunization section for updates.  Tdap booster given today.  Education counseling done Routine labs and screening tests ordered including cmp, cbc and lipids where appropriate. Discussed recommendations regarding Vit D and calcium supplementation (see AVS)  Chronic disease management visit and/or acute problem visit: Essential hypertension with fair control: Continue telmisartan 80/25 daily and add amlodipine 5 mg daily.  Recheck in 3 months.  Check renal function electrolytes.  Continue low-sodium diet. Subclinical hypothyroidism: Recheck levels today.  Adjust medicines if needed.  Clinically well Prediabetes: Continue to educate about diet, increase exercise.  Monitor A1c.  Continue metformin 500 twice daily due to PCOS and predisposition to diabetes.  Also regulate cycles. No need for birth control Gallstones without biliary colic.  Monitor for symptoms  Follow up: 3 months for hypertension check Orders Placed This Encounter  Procedures   Tdap vaccine greater than or equal to 7yo IM   CBC with Differential/Platelet   Comprehensive metabolic panel   Lipid panel   TSH   Hemoglobin A1c   Meds ordered this encounter  Medications   amLODipine (NORVASC) 5 MG tablet    Sig: Take 1 tablet (5 mg total) by mouth daily.    Dispense:  90 tablet    Refill:  3      Body mass index is 24.59 kg/m. Wt Readings from Last 3 Encounters:  11/09/22 141 lb (64 kg)  07/05/22 142 lb  3.2 oz (64.5 kg)  06/25/22 138 lb 14.2 oz (63 kg)     Patient Active Problem List   Diagnosis Date Noted   Prediabetes 12/01/2020    Priority: High   Subclinical hypothyroidism 07/17/2018    Priority: High   Essential hypertension 07/17/2018    Priority: High    She has an ACE inhibitor cough.  She has used nifedipine in the past which caused hair thinning.  Has also been on amlodipine which she says she tolerated.  Was on labetalol which she felt fatigued and dizzy in the past.  She was switched off amlodipine and change to nifedipine when she was trying to conceive. Restarted amlodipine May 2024 in addition to Micardis HCT to get better control.    Infertility associated with anovulation 07/17/2018    Priority: Medium    Cholelithiases - asymptomatic 07/17/2018    Priority: Medium     By ultrasound    PCOS (polycystic ovarian syndrome)     Priority: Medium    Varicose veins of both lower extremities with pain 08/25/2020    Priority: Low   Health Maintenance  Topic Date Due   COVID-19 Vaccine (3 - 2023-24 season) 11/25/2022 (Originally 02/26/2022)   INFLUENZA VACCINE  01/27/2023   PAP SMEAR-Modifier  06/02/2025   DTaP/Tdap/Td (2 - Td or Tdap) 11/08/2032   Hepatitis C Screening  Completed   HIV Screening  Completed   HPV VACCINES  Aged Out   Immunization History  Administered Date(s) Administered   Influenza,inj,Quad PF,6+ Mos 03/23/2019, 03/10/2022   Influenza-Unspecified 05/03/2020   Moderna Sars-Covid-2 Vaccination 09/13/2019, 10/11/2019   Tdap 11/09/2022   We updated and reviewed the patient's past history in detail and it is documented below. Allergies: Patient is allergic to ace inhibitors and labetalol hcl. Past Medical History Patient  has a past medical history of Cervical incompetence, Cholelithiases - asymptomatic (07/17/2018), Essential hypertension (07/17/2018), History of cervical cerclage (02/26/2021), History of cervical incompetence (02/26/2021), History of  severe pre-eclampsia (02/26/2021), Hypertension, Infertility associated with anovulation, PCOS (polycystic ovarian syndrome), and Subclinical hypothyroidism (07/17/2018). Past Surgical History Patient  has a past surgical history that includes Cervical cerclage (2014) and Cesarean section (2014). Family History: Patient family history includes Diabetes in her father; Healthy in her daughter; Hypertension in her mother; Thyroid disease in her father. Social History:  Patient  reports that she has never smoked. She has never used smokeless tobacco. She reports that she does not drink alcohol and does not use drugs.  Review of Systems: Constitutional: negative for fever or malaise Ophthalmic: negative for photophobia, double vision or loss of vision Cardiovascular: negative for chest pain, dyspnea on exertion, or new LE swelling Respiratory: negative for SOB or persistent cough Gastrointestinal: negative for abdominal pain, change in bowel habits or melena Genitourinary: negative for dysuria or gross hematuria, no abnormal uterine bleeding or disharge Musculoskeletal: negative for new gait disturbance or muscular weakness Integumentary: negative for new or persistent rashes, no breast lumps Neurological: negative for TIA or stroke symptoms Psychiatric: negative for SI or delusions Allergic/Immunologic: negative for hives  Patient Care Team    Relationship Specialty Notifications Start End  Willow Ora, MD PCP - General Family Medicine  07/17/18     Objective  Vitals: BP 138/88   Pulse 86   Temp 98.7 F (37.1 C)   Ht 5' 3.5" (1.613 m)   Wt 141 lb (64 kg)   SpO2 99%   BMI 24.59 kg/m  General:  Well developed, well nourished, no acute distress  Psych:  Alert and orientedx3,normal mood and affect HEENT:  Normocephalic, atraumatic, non-icteric sclera,  supple neck without adenopathy, mass or thyromegaly Cardiovascular:  Normal S1, S2, RRR without gallop, rub or murmur Respiratory:   Good breath sounds bilaterally, CTAB with normal respiratory effort Gastrointestinal: normal bowel sounds, soft, non-tender, no noted masses. No HSM MSK: extremities without edema, joints without erythema or swelling Neurologic:    Mental status is normal.  Gross motor and sensory exams are normal.  No tremor  Commons side effects, risks, benefits, and alternatives for medications and treatment plan prescribed today were discussed, and the patient expressed understanding of the given instructions. Patient is instructed to call or message via MyChart if he/she has any questions or concerns regarding our treatment plan. No barriers to understanding were identified. We discussed Red Flag symptoms and signs in detail. Patient expressed understanding regarding what to do in case of urgent or emergency type symptoms.  Medication list was reconciled, printed and provided to the patient in AVS. Patient instructions and summary information was reviewed with the patient as documented in the AVS. This note was prepared with assistance of Dragon voice recognition software. Occasional wrong-word or sound-a-like substitutions may have occurred due to the inherent limitations of voice recognition software

## 2022-11-11 ENCOUNTER — Other Ambulatory Visit: Payer: Self-pay | Admitting: Family Medicine

## 2022-11-14 ENCOUNTER — Encounter: Payer: Self-pay | Admitting: Family Medicine

## 2022-11-14 NOTE — Progress Notes (Signed)
See my chart note.

## 2022-11-20 ENCOUNTER — Encounter: Payer: Self-pay | Admitting: Family Medicine

## 2022-11-30 ENCOUNTER — Encounter: Payer: Self-pay | Admitting: Family Medicine

## 2022-11-30 ENCOUNTER — Other Ambulatory Visit: Payer: Self-pay

## 2022-11-30 MED ORDER — TELMISARTAN-HCTZ 80-25 MG PO TABS: 1.0000 | ORAL_TABLET | Freq: Every day | ORAL | 3 refills | Status: DC

## 2022-12-17 ENCOUNTER — Other Ambulatory Visit: Payer: Self-pay

## 2022-12-17 ENCOUNTER — Other Ambulatory Visit (HOSPITAL_BASED_OUTPATIENT_CLINIC_OR_DEPARTMENT_OTHER): Payer: Self-pay

## 2022-12-17 ENCOUNTER — Emergency Department (HOSPITAL_BASED_OUTPATIENT_CLINIC_OR_DEPARTMENT_OTHER)
Admission: EM | Admit: 2022-12-17 | Discharge: 2022-12-17 | Disposition: A | Discharge status: Home / Self Care | Payer: BC Managed Care – PPO | Attending: Emergency Medicine | Admitting: Emergency Medicine

## 2022-12-17 ENCOUNTER — Encounter (HOSPITAL_BASED_OUTPATIENT_CLINIC_OR_DEPARTMENT_OTHER): Payer: Self-pay

## 2022-12-17 DIAGNOSIS — R220 Localized swelling, mass and lump, head: Secondary | ICD-10-CM | POA: Diagnosis not present

## 2022-12-17 DIAGNOSIS — L03211 Cellulitis of face: Secondary | ICD-10-CM | POA: Diagnosis not present

## 2022-12-17 MED ORDER — AMOXICILLIN-POT CLAVULANATE 875-125 MG PO TABS: 1.0000 | ORAL_TABLET | Freq: Two times a day (BID) | ORAL | 0 refills | Status: AC

## 2022-12-17 NOTE — ED Notes (Signed)
Pt given discharge instructions and reviewed prescriptions. Opportunities given for questions. Pt verbalizes understanding. Jasnoor Trussell R, RN 

## 2022-12-17 NOTE — ED Triage Notes (Signed)
Patient arrives with complaints of worsening right side facial swelling and pain related to her having a dental procedure. Patient has been trying to follow-up with her dentist since the procedure with minimal answers. Started on antibiotics by a different provider.   Unable to eat properly or brush her teeth due to swelling/pain.

## 2022-12-17 NOTE — Discharge Instructions (Addendum)
It was a pleasure caring for you today in the emergency department.  You may eat and drink as you please, and take Tylenol or ibuprofen for needed this pain.  We are going to treat you with an antibiotic called Augmentin that is stronger than amoxicillin.  If your symptoms or not improving or worsening in 48 hours, please return to care.  At that time, we can provide you with additional treatment and imaging if indicated.  Please follow up with your PCP in the next 3-5 days for a recheck. Please return to the emergency department for any worsening or worrisome symptoms.

## 2022-12-17 NOTE — ED Provider Notes (Signed)
[place] EMERGENCY DEPARTMENT AT [facility] Provider Note   CSN: [id number] Arrival date & time: 12/17/22  1231     History  Chief Complaint  Patient presents with   Facial Swelling   Dental Problem    Angelica Harris is a 37 y.o. female here with tooth and face pain after dental procedure 2.5 days ago.  She had a crown placement of her right central incisor on Tuesday evening.  The next morning, she had facial swelling, worse on the right side and upper lip. Reports subjective fever and chills the day of and the day after her procedure. Her husband says she has had 6-7 dental  over the past year, and has never had a reaction like this. Denies difficulty swallowing.  No tightness in her airway or throat. She returned to the dental office yesterday (6/20) due to her symptoms, and a different provider than when he did her procedure prescribed her amoxicillin 3 TID for 7 days. She is here today due to minimal improvement in her symptoms.       Home Medications Prior to Admission medications   Medication Sig Start Date End Date Taking? Authorizing Provider  amoxicillin-clavulanate (AUGMENTIN) 875-125 MG tablet Take 1 tablet by mouth every 12 (twelve) hours for 7 days. 12/17/22 12/24/22 Yes Tay Whitwell, Nolberto Hanlon, DO  amLODipine (NORVASC) 5 MG tablet Take 1 tablet (5 mg total) by mouth daily. 11/09/22   Willow Ora, MD  levothyroxine (SYNTHROID) 25 MCG tablet TAKE 1 TABLET DAILY 11/11/22   Willow Ora, MD  metFORMIN (GLUCOPHAGE) 500 MG tablet TAKE 1 TABLET TWICE A DAY WITH MEALS 07/07/22   Willow Ora, MD  Multiple Vitamin (MULTIVITAMIN WITH MINERALS) TABS tablet Take 1 tablet by mouth daily.    [provider]  telmisartan-hydrochlorothiazide (MICARDIS HCT) 80-25 MG tablet Take 1 tablet by mouth daily. 11/30/22   Willow Ora, MD      Allergies    Ace inhibitors and Labetalol hcl    Review of Systems   Review of Systems  Constitutional:   Positive for chills and fever.    Physical Exam Updated Vital Signs BP (!) 135/90 (BP Location: Right Arm)   Pulse (!) 112   Temp 98.4 F (36.9 C) (Oral)   Resp 20   Ht 5\' 3"  (1.6 m)   Wt 64 kg   SpO2 100%   BMI 24.99 kg/m  Physical Exam Constitutional:      Appearance: Normal appearance.  HENT:     Head:     Comments: Diffuse upper lip swelling.  Right-sided face with erythema, warmth, swelling.    Mouth/Throat:     Mouth: Mucous membranes are moist.     Pharynx: Oropharynx is clear.  Eyes:     Extraocular Movements: Extraocular movements intact.     Conjunctiva/sclera: Conjunctivae normal.     Pupils: Pupils are equal, round, and reactive to light.  Cardiovascular:     Rate and Rhythm: Normal rate and regular rhythm.  Pulmonary:     Effort: Pulmonary effort is normal.     Breath sounds: Normal breath sounds.  Skin:    General: Skin is warm and dry.  Neurological:     Mental Status: She is alert.     ED Results / Procedures / Treatments   Labs (all labs ordered are listed, but only abnormal results are displayed) Labs Reviewed - No data to display  EKG None  Radiology No results found.  Procedures Procedures  Medications Ordered in ED Medications - No data to display  ED Course/ Medical Decision Making/ A&P                             Medical Decision Making 38 year old female here with facial swelling and discomfort after dental procedure 3 days ago, most fitting with cellulitis.  At this time, no concern for abscess. On exam, well-appearing and in no acute distress.  Airway is patent. Does have significant upper lip swelling and mild right-sided facial swelling with overlying erythema and warmth. Afebrile, no systemic signs of infection.  Does not meet SIRS criteria.  Would not yet call this outpatient antibiotic failure as she is only been on amoxicillin for the past day.  However, will change antibiotic regimen to Augmentin for anaerobic  coverage.  If no improvement within the next 48 hours, encourage patient to return for care for possible CT maxillofacial if necessary at that point.  Otherwise, pain control with Tylenol and ibuprofen; Benadryl for swelling for possible underlying allergic reaction as well to anesthetic.  Patient discharged home in stable condition.  Work note provided.  Return precautions discussed prior to discharge.  Risk Prescription drug management.           Final Clinical Impression(s) / ED Diagnoses Final diagnoses:  Cellulitis of face    Rx / DC Orders ED Discharge Orders          Ordered    amoxicillin-clavulanate (AUGMENTIN) 875-125 MG tablet  Every 12 hours        12/17/22 1453              Darral Dash, DO 12/17/22 1610    Blane Ohara, MD 12/18/22 1448

## 2022-12-22 ENCOUNTER — Encounter: Payer: Self-pay | Admitting: Family Medicine

## 2023-01-26 ENCOUNTER — Encounter (INDEPENDENT_AMBULATORY_CARE_PROVIDER_SITE_OTHER): Payer: Self-pay

## 2023-02-10 ENCOUNTER — Ambulatory Visit: Payer: BC Managed Care – PPO | Admitting: Family Medicine

## 2023-02-10 ENCOUNTER — Encounter: Payer: Self-pay | Admitting: Family Medicine

## 2023-02-10 VITALS — BP 124/78 | HR 99 | Temp 99.2°F | Ht 63.0 in | Wt 144.0 lb

## 2023-02-10 DIAGNOSIS — E282 Polycystic ovarian syndrome: Secondary | ICD-10-CM

## 2023-02-10 DIAGNOSIS — I10 Essential (primary) hypertension: Secondary | ICD-10-CM

## 2023-02-10 DIAGNOSIS — R7303 Prediabetes: Secondary | ICD-10-CM | POA: Diagnosis not present

## 2023-02-10 LAB — POCT GLYCOSYLATED HEMOGLOBIN (HGB A1C): Hemoglobin A1C: 6.2 % — AB (ref 4.0–5.6)

## 2023-02-10 NOTE — Progress Notes (Signed)
Subjective  CC:  Chief Complaint  Patient presents with   Diabetes   Hypertension    HPI: Angelica Harris is a 37 y.o. female who presents to the office today for follow up of diabetes and problems listed above in the chief complaint.  Prediabetes follow-up: Most recent A1c was 6.4 on metformin 500 twice daily.  We discussed diagnosis of diabetes versus prediabetes.  Patient is working hard to eat well.  She has gained a few pounds but was on vacation at [place] recently.  She needs to restart her exercise program she does complain of some fatigue.  No symptoms of hyperglycemia.  Hypertension: We added amlodipine 5 mg daily to telmisartan HCTZ 80/25 and she is tolerating both medications well.  Does not check blood pressures at home.  Reports an episode where she had left upper arm soreness and pleuritic type chest pain.  She thought it may be related to GERD.  Symptoms have resolved.  No exertional symptoms.  No lower extremity edema.  Wt Readings from Last 3 Encounters:  02/10/23 144 lb (65.3 kg)  12/17/22 141 lb 1.5 oz (64 kg)  11/09/22 141 lb (64 kg)    BP Readings from Last 3 Encounters:  02/10/23 124/78  12/17/22 (!) 135/90  11/09/22 138/88   Lab Results  Component Value Date   HGBA1C 6.2 (A) 02/10/2023   HGBA1C 6.4 11/09/2022   HGBA1C 5.9 (A) 03/10/2022     Assessment  1. Essential hypertension   2. Prediabetes   3. PCOS (polycystic ovarian syndrome)      Plan  Prediabetes w/ pcos: Discussed predisposition to diabetes.  She will follow a low carb diet.  Recommend starting walking for exercise to lose a few pounds that she had gained.  Will continue metformin 500 twice daily for cycle regulation and monitor sugars closely.  Will recheck in 6 months.  Recommend eye exam.  LDL is 88.  Will monitor. Hypertension: Much better on current medications.  She will start checking at home.  Can titrate up amlodipine if diastolics run high.  Patient understands and  agrees.  Reassured, recent fatigue arm pain pleuritic chest pain does not sound heart related.  Could have been a viral illness, pleurisy, GERD etc.  She will monitor for recurrent symptoms.  She will return if needed PCOS: Metformin 500 twice daily  Follow up: 6 months to recheck blood pressure and A1c Orders Placed This Encounter  Procedures   POCT HgB A1C   No orders of the defined types were placed in this encounter.     Immunization History  Administered Date(s) Administered   Influenza,inj,Quad PF,6+ Mos 03/23/2019, 03/10/2022   Influenza-Unspecified 05/03/2020   Moderna Sars-Covid-2 Vaccination 09/13/2019, 10/11/2019   Tdap 11/09/2022    Diabetes Related Lab Review: Lab Results  Component Value Date   HGBA1C 6.2 (A) 02/10/2023   HGBA1C 6.4 11/09/2022   HGBA1C 5.9 (A) 03/10/2022    No results found for: "MICROALBUR", "[id number]" Lab Results  Component Value Date   CREATININE 0.73 11/09/2022   BUN 8 11/09/2022   NA 138 11/09/2022   K 3.7 11/09/2022   CL 102 11/09/2022   CO2 27 11/09/2022   Lab Results  Component Value Date   CHOL 171 11/09/2022   CHOL 182 09/23/2021   CHOL 169 06/02/2020   Lab Results  Component Value Date   HDL 47.60 11/09/2022   HDL 50.30 09/23/2021   HDL 46 (L) 06/02/2020   Lab Results  Component Value  Date   LDLCALC 88 11/09/2022   LDLCALC 93 09/23/2021   LDLCALC 100 (H) 06/02/2020   Lab Results  Component Value Date   TRIG 177.0 (H) 11/09/2022   TRIG 196.0 (H) 09/23/2021   TRIG 130 06/02/2020   Lab Results  Component Value Date   CHOLHDL 4 11/09/2022   CHOLHDL 4 09/23/2021   CHOLHDL 3.7 06/02/2020   Lab Results  Component Value Date   LDLDIRECT 115.0 03/23/2019   The ASCVD Risk score (Arnett DK, et al., 2019) failed to calculate for the following reasons:   The 2019 ASCVD risk score is only valid for ages 47 to 85 I have reviewed the PMH, Fam and Soc history. Patient Active Problem List   Diagnosis Date Noted Date  Diagnosed   Prediabetes 12/01/2020     Priority: High    A1c max 5.4 Dx 10/2022, PCOS and metformin. Shared decision making with patient: will monitor closely.    Subclinical hypothyroidism 07/17/2018     Priority: High   Essential hypertension 07/17/2018     Priority: High    She has an ACE inhibitor cough.  She has used nifedipine in the past which caused hair thinning.  Has also been on amlodipine which she says she tolerated.  Was on labetalol which she felt fatigued and dizzy in the past.  She was switched off amlodipine and change to nifedipine when she was trying to conceive. Restarted amlodipine May 2024 in addition to Micardis HCT to get better control.    Infertility associated with anovulation 07/17/2018     Priority: Medium    Cholelithiases - asymptomatic 07/17/2018     Priority: Medium     By ultrasound    PCOS (polycystic ovarian syndrome)      Priority: Medium    Varicose veins of both lower extremities with pain 08/25/2020     Priority: Low    Social History: Patient  reports that she has never smoked. She has never used smokeless tobacco. She reports that she does not drink alcohol and does not use drugs.  Review of Systems: Ophthalmic: negative for eye pain, loss of vision or double vision Cardiovascular: negative for chest pain Respiratory: negative for SOB or persistent cough Gastrointestinal: negative for abdominal pain Genitourinary: negative for dysuria or gross hematuria MSK: negative for foot lesions Neurologic: negative for weakness or gait disturbance  Objective  Vitals: BP 124/78   Pulse 99   Temp 99.2 F (37.3 C)   Ht 5\' 3"  (1.6 m)   Wt 144 lb (65.3 kg)   SpO2 98%   BMI 25.51 kg/m  General: well appearing, no acute distress  Psych:  Alert and oriented, normal mood and affect HEENT:  Normocephalic, atraumatic, moist mucous membranes, supple neck  Cardiovascular:  RRR Foot exam: no erythema, pallor, or cyanosis visible nl proprioception and  sensation to monofilament testing bilaterally, +2 distal pulses bilaterally    Diabetic education: ongoing education regarding chronic disease management for diabetes was given today. We continue to reinforce the ABC's of diabetic management: A1c (<7 or 8 dependent upon patient), tight blood pressure control, and cholesterol management with goal LDL < 100 minimally. We discuss diet strategies, exercise recommendations, medication options and possible side effects. At each visit, we review recommended immunizations and preventive care recommendations for diabetics and stress that good diabetic control can prevent other problems. See below for this patient's data.   Commons side effects, risks, benefits, and alternatives for medications and treatment plan prescribed today were  discussed, and the patient expressed understanding of the given instructions. Patient is instructed to call or message via MyChart if he/she has any questions or concerns regarding our treatment plan. No barriers to understanding were identified. We discussed Red Flag symptoms and signs in detail. Patient expressed understanding regarding what to do in case of urgent or emergency type symptoms.  Medication list was reconciled, printed and provided to the patient in AVS. Patient instructions and summary information was reviewed with the patient as documented in the AVS. This note was prepared with assistance of Dragon voice recognition software. Occasional wrong-word or sound-a-like substitutions may have occurred due to the inherent limitations of voice recognition software

## 2023-02-10 NOTE — Patient Instructions (Addendum)
Please return in 6 months to recheck sugars and blood pressure    Check your blood pressures at home: our goal is to have them consistently < 130/80. :) Start walking again for exercise.   If you have any questions or concerns, please don't hesitate to send me a message via MyChart or call the office at [phone]. Thank you for visiting with [place] today! It's our pleasure caring for you.

## 2023-02-27 ENCOUNTER — Encounter: Payer: Self-pay | Admitting: Family Medicine

## 2023-03-17 ENCOUNTER — Ambulatory Visit: Payer: BC Managed Care – PPO | Admitting: Family Medicine

## 2023-03-17 ENCOUNTER — Other Ambulatory Visit (HOSPITAL_BASED_OUTPATIENT_CLINIC_OR_DEPARTMENT_OTHER): Payer: Self-pay

## 2023-03-17 ENCOUNTER — Encounter: Payer: Self-pay | Admitting: Family Medicine

## 2023-03-17 VITALS — BP 134/82 | HR 87 | Temp 98.3°F | Wt 148.8 lb

## 2023-03-17 DIAGNOSIS — M5432 Sciatica, left side: Secondary | ICD-10-CM | POA: Diagnosis not present

## 2023-03-17 DIAGNOSIS — M7062 Trochanteric bursitis, left hip: Secondary | ICD-10-CM

## 2023-03-17 DIAGNOSIS — R300 Dysuria: Secondary | ICD-10-CM

## 2023-03-17 DIAGNOSIS — N898 Other specified noninflammatory disorders of vagina: Secondary | ICD-10-CM

## 2023-03-17 MED ORDER — DICLOFENAC SODIUM 75 MG PO TBEC
75.0000 mg | DELAYED_RELEASE_TABLET | Freq: Two times a day (BID) | ORAL | 0 refills | Status: DC
  Filled 2023-03-17: qty 30, 15d supply, fill #0

## 2023-03-17 MED ORDER — FLUCONAZOLE 150 MG PO TABS
ORAL_TABLET | ORAL | 0 refills | Status: DC
  Filled 2023-03-17: qty 2, 3d supply, fill #0

## 2023-03-17 NOTE — Progress Notes (Signed)
Subjective  CC:  Chief Complaint  Patient presents with   Sciatica    Pt c/o of Sciatic pain for 1 month, pt has tried Tylenol without relief, pt states that it feels more in her muscles.    Urinary Frequency    Pt states that she has frequent urination, with vaginal itching, and burning w/urination    sleep issues    Pt states that she can fall asleep, however pt can not fall back to sleep on her own    HPI: Angelica Harris is a 37 y.o. female who presents to the office today to address the problems listed above in the chief complaint. Left low back and leg pain: Started about a month ago.  Thinks it sciatica.  Pain starts at left lower buttocks and travels down to ankle.  Some decreased range of motion, increased pain with flexion.  Hard to sleep on left side now.  Took a few Tylenol but nothing more.  No injury.  No knee pain or ankle pain.  New problem Complains of vaginal itching, burning with urination which started after vigorous intercourse.  Had some postcoital spotting.  Denies significant discharge.  No pelvic pain.  No fevers.  Regular menstrual cycle  Assessment  1. Sciatica, left side   2. Vaginal itching   3. Dysuria   4. Greater trochanteric bursitis, left      Plan  Sciatica left: Subacute.  Patient does not like medications but will try Voltaren 75 twice daily for 1 to 2 weeks.  Refer to physical therapy.  Follow-up if not improving Vaginal itching: Presumed yeast vaginitis.  Diflucan ordered. Will ensure no UTI with urine culture  Follow up: As needed 08/15/2023  Orders Placed This Encounter  Procedures   Urine Culture   Ambulatory referral to Physical Therapy   Meds ordered this encounter  Medications   fluconazole (DIFLUCAN) 150 MG tablet    Sig: Take one tablet today; may repeat in 3 days if symptoms persist    Dispense:  2 tablet    Refill:  0   diclofenac (VOLTAREN) 75 MG EC tablet    Sig: Take 1 tablet (75 mg total) by mouth 2  (two) times daily.    Dispense:  30 tablet    Refill:  0      I reviewed the patients updated PMH, FH, and SocHx.    Patient Active Problem List   Diagnosis Date Noted   Prediabetes 12/01/2020    Priority: High   Subclinical hypothyroidism 07/17/2018    Priority: High   Essential hypertension 07/17/2018    Priority: High   Infertility associated with anovulation 07/17/2018    Priority: Medium    Cholelithiases - asymptomatic 07/17/2018    Priority: Medium    PCOS (polycystic ovarian syndrome)     Priority: Medium    Varicose veins of both lower extremities with pain 08/25/2020    Priority: Low   Current Meds  Medication Sig   amLODipine (NORVASC) 5 MG tablet Take 1 tablet (5 mg total) by mouth daily.   diclofenac (VOLTAREN) 75 MG EC tablet Take 1 tablet (75 mg total) by mouth 2 (two) times daily.   fluconazole (DIFLUCAN) 150 MG tablet Take one tablet today; may repeat in 3 days if symptoms persist   levothyroxine (SYNTHROID) 25 MCG tablet TAKE 1 TABLET DAILY   metFORMIN (GLUCOPHAGE) 500 MG tablet TAKE 1 TABLET TWICE A DAY WITH MEALS   Multiple Vitamin (MULTIVITAMIN WITH MINERALS)  TABS tablet Take 1 tablet by mouth daily.   telmisartan-hydrochlorothiazide (MICARDIS HCT) 80-25 MG tablet Take 1 tablet by mouth daily.    Allergies: Patient is allergic to ace inhibitors and labetalol hcl. Family History: Patient family history includes Diabetes in her father; Healthy in her daughter; Hypertension in her mother; Thyroid disease in her father. Social History:  Patient  reports that she has never smoked. She has never used smokeless tobacco. She reports that she does not drink alcohol and does not use drugs.  Review of Systems: Constitutional: Negative for fever malaise or anorexia Cardiovascular: negative for chest pain Respiratory: negative for SOB or persistent cough Gastrointestinal: negative for abdominal pain  Objective  Vitals: BP 134/82   Pulse 87   Temp 98.3 F  (36.8 C)   Wt 148 lb 12.8 oz (67.5 kg)   SpO2 99%   BMI 26.36 kg/m  General: no acute distress , A&Ox3 Back: Left-sided notch tenderness, left greater trochanteric bursa is tender, negative straight leg raise bilaterally.  +2 DTRs.  Mildly decreased forward flexion but otherwise normal range of motion of back.  Commons side effects, risks, benefits, and alternatives for medications and treatment plan prescribed today were discussed, and the patient expressed understanding of the given instructions. Patient is instructed to call or message via MyChart if he/she has any questions or concerns regarding our treatment plan. No barriers to understanding were identified. We discussed Red Flag symptoms and signs in detail. Patient expressed understanding regarding what to do in case of urgent or emergency type symptoms.  Medication list was reconciled, printed and provided to the patient in AVS. Patient instructions and summary information was reviewed with the patient as documented in the AVS. This note was prepared with assistance of Dragon voice recognition software. Occasional wrong-word or sound-a-like substitutions may have occurred due to the inherent limitations of voice recognition software

## 2023-03-17 NOTE — Patient Instructions (Signed)
Please follow up if symptoms do not improve or as needed.    Sciatica  Sciatica is pain, numbness, weakness, or tingling along the path of the sciatic nerve. The sciatic nerve starts in the lower back and runs down the back of each leg. The nerve controls the muscles in the lower leg and in the back of the knee. It also provides feeling (sensation) to the back of the thigh, the lower leg, and the sole of the foot. Sciatica is a symptom of another medical condition that pinches or puts pressure on the sciatic nerve. Sciatica most often only affects one side of the body. Sciatica usually goes away on its own or with treatment. In some cases, sciatica may come back (recur). What are the causes? This condition is caused by pressure on the sciatic nerve or pinching of the nerve. This may be the result of: A disk in between the bones of the spine bulging out too far (herniated disk). Age-related changes in the spinal disks. A pain disorder that affects a muscle in the buttock. Extra bone growth near the sciatic nerve. A break (fracture) of the pelvis. Pregnancy. Tumor. This is rare. What increases the risk? The following factors may make you more likely to develop this condition: Playing sports that place pressure or stress on the spine. Having poor strength and flexibility. A history of back injury or surgery. Sitting for long periods of time. Doing activities that involve repetitive bending or lifting. Obesity. What are the signs or symptoms? Symptoms can vary from mild to very severe. They may include: Any of the following problems in the lower back, leg, hip, or buttock: Mild tingling, numbness, or dull aches. Burning sensations. Sharp pains. Numbness in the back of the calf or the sole of the foot. Leg weakness. Severe back pain that makes movement difficult. Symptoms may get worse when you cough, sneeze, or laugh, or when you sit or stand for long periods of time. How is this  diagnosed? This condition may be diagnosed based on: Your symptoms and medical history. A physical exam. Blood tests. Imaging tests, such as: X-rays. An MRI. A CT scan. How is this treated? In many cases, this condition improves on its own without treatment. However, treatment may include: Reducing or modifying physical activity. Exercising, including strengthening and stretching. Icing and applying heat to the affected area. Medicines that help to: Relieve pain and swelling. Relax your muscles. Injections of medicines that help to relieve pain and inflammation (steroids) around the sciatic nerve. Surgery. Follow these instructions at home: Medicines Take over-the-counter and prescription medicines only as told by your health care provider. Ask your health care provider if the medicine prescribed to you requires you to avoid driving or using heavy machinery. Managing pain     If directed, put ice on the affected area. To do this: Put ice in a plastic bag. Place a towel between your skin and the bag. Leave the ice on for 20 minutes, 2-3 times a day. If your skin turns bright red, remove the ice right away to prevent skin damage. The risk of skin damage is higher if you cannot feel pain, heat, or cold. If directed, apply heat to the affected area as often as told by your health care provider. Use the heat source that your health care provider recommends, such as a moist heat pack or a heating pad. Place a towel between your skin and the heat source. Leave the heat on for 20-30 minutes. If  your skin turns bright red, remove the heat right away to prevent burns. The risk of burns is higher if you cannot feel pain, heat, or cold. Activity  Return to your normal activities as told by your health care provider. Ask your health care provider what activities are safe for you. Avoid activities that make your symptoms worse. Take brief periods of rest throughout the day. When you rest  for longer periods, mix in some mild activity or stretching between periods of rest. This will help to prevent stiffness and pain. Avoid sitting for long periods of time without moving. Get up and move around at least one time each hour. Exercise and stretch regularly as told by your health care provider. Do not lift anything that is heavier than 10 lb (4.5 kg) until your health care provider says that it is safe. When you do not have symptoms, you should still avoid heavy lifting, especially repetitive heavy lifting. When you lift objects, always use proper lifting technique, which includes: Bending your knees. Keeping the load close to your body. Avoiding twisting. General instructions Maintain a healthy weight. Excess weight puts extra stress on your back. Wear supportive, comfortable shoes. Avoid wearing high heels. Avoid sleeping on a mattress that is too soft or too hard. A mattress that is firm enough to support your back when you sleep may help to reduce your pain. Contact a health care provider if: Your pain is not controlled by medicine. Your pain does not improve or gets worse. Your pain lasts longer than 4 weeks. You have unexplained weight loss. Get help right away if: You are not able to control when you urinate or have bowel movements (incontinence). You have: Weakness in your lower back, pelvis, buttocks, or legs that gets worse. Redness or swelling of your back. A burning sensation when you urinate. Summary Sciatica is pain, numbness, weakness, or tingling along the path of the sciatic nerve, which may include the lower back, legs, hips, and buttocks. This condition is caused by pressure on the sciatic nerve or pinching of the nerve. Treatment often includes rest, exercise, medicines, and applying ice or heat. This information is not intended to replace advice given to you by your health care provider. Make sure you discuss any questions you have with your health care  provider. Document Revised: 09/21/2021 Document Reviewed: 09/21/2021 Elsevier Patient Education  2024 [employer].

## 2023-03-19 LAB — URINE CULTURE
MICRO NUMBER:: 15489607
SPECIMEN QUALITY:: ADEQUATE

## 2023-03-20 MED ORDER — NITROFURANTOIN MONOHYD MACRO 100 MG PO CAPS
100.0000 mg | ORAL_CAPSULE | Freq: Two times a day (BID) | ORAL | 0 refills | Status: DC
  Filled 2023-03-20: qty 10, 5d supply, fill #0

## 2023-03-20 NOTE — Addendum Note (Signed)
Addended by: Asencion Partridge on: 03/20/2023 08:45 PM   Modules accepted: Orders

## 2023-03-20 NOTE — Progress Notes (Signed)
See mychart note Dear Ms. Waner, Your urine did show infection so I have ordered an antibiotic for you to take.  This should improve your symptoms along with the treatment for yeast.   I hope your sciatica is starting to improve.  Sincerely, Dr. Mardelle Matte  Macrobid x 5 days

## 2023-03-21 ENCOUNTER — Other Ambulatory Visit: Payer: Self-pay

## 2023-03-21 ENCOUNTER — Other Ambulatory Visit (HOSPITAL_BASED_OUTPATIENT_CLINIC_OR_DEPARTMENT_OTHER): Payer: Self-pay

## 2023-03-22 ENCOUNTER — Telehealth: Payer: Self-pay | Admitting: Family Medicine

## 2023-03-22 ENCOUNTER — Other Ambulatory Visit: Payer: Self-pay

## 2023-03-22 MED ORDER — TELMISARTAN-HCTZ 80-25 MG PO TABS: 1.0000 | ORAL_TABLET | Freq: Every day | ORAL | 3 refills | Status: DC

## 2023-03-22 MED ORDER — AMLODIPINE BESYLATE 5 MG PO TABS: 5.0000 mg | ORAL_TABLET | Freq: Every day | ORAL | 3 refills | Status: DC

## 2023-03-22 NOTE — Telephone Encounter (Signed)
Prescription Request  03/22/2023  LOV: 03/17/2023  What is the name of the medication or equipment? telmisartan-hydrochlorothiazide (MICARDIS HCT) 80-25 MG tablet   amLODipine (NORVASC) 5 MG tablet   Have you contacted your pharmacy to request a refill? Yes   Which pharmacy would you like this sent to?   [facility] [address] [place], [place] [address] [phone]   Patient notified that their request is being sent to the clinical staff for review and that they should receive a response within 2 business days.   Please advise at Mobile [phone] (mobile)

## 2023-03-28 ENCOUNTER — Ambulatory Visit: Payer: BC Managed Care – PPO | Admitting: Physical Therapy

## 2023-03-30 ENCOUNTER — Encounter: Payer: Self-pay | Admitting: Family Medicine

## 2023-03-30 ENCOUNTER — Other Ambulatory Visit: Payer: Self-pay

## 2023-03-30 MED ORDER — AMLODIPINE BESYLATE 5 MG PO TABS: 5.0000 mg | ORAL_TABLET | Freq: Every day | ORAL | 3 refills | Status: DC

## 2023-03-30 MED ORDER — TELMISARTAN-HCTZ 80-25 MG PO TABS: 1.0000 | ORAL_TABLET | Freq: Every day | ORAL | 3 refills | Status: DC

## 2023-03-30 NOTE — Telephone Encounter (Signed)
Resent rx to [facility] per pt's request.   Angelica Harris -RMA

## 2023-03-31 ENCOUNTER — Other Ambulatory Visit (HOSPITAL_BASED_OUTPATIENT_CLINIC_OR_DEPARTMENT_OTHER): Payer: Self-pay

## 2023-03-31 NOTE — Therapy (Signed)
OUTPATIENT PHYSICAL THERAPY THORACOLUMBAR EVALUATION   Patient Name: Angelica Harris MRN: [id number] DOB:Dec 17, 1985, 37 y.o., female Today's Date: 04/04/2023  END OF SESSION:  PT End of Session - 04/04/23 1057     Visit Number 1    Number of Visits 12    Date for PT Re-Evaluation 06/27/23    Authorization Type VL [id number] $40 copay    Authorization - Visit Number 1    Authorization - Number of Visits 60    Progress Note Due on Visit 10    PT Start Time 0936    PT Stop Time 1028    PT Time Calculation (min) 52 min    Activity Tolerance Patient tolerated treatment well    Behavior During Therapy [facility] for tasks assessed/performed             Past Medical History:  Diagnosis Date   Cervical incompetence    Cholelithiases - asymptomatic 07/17/2018   By ultrasound   Essential hypertension 07/17/2018   History of cervical cerclage 02/26/2021   History of cervical incompetence 02/26/2021   History of severe pre-eclampsia 02/26/2021   Hypertension    Infertility associated with anovulation    PCOS (polycystic ovarian syndrome)    Subclinical hypothyroidism 07/17/2018   Past Surgical History:  Procedure Laterality Date   CERVICAL CERCLAGE  2014   CESAREAN SECTION  2014   Patient Active Problem List   Diagnosis Date Noted   Prediabetes 12/01/2020   Varicose veins of both lower extremities with pain 08/25/2020   Subclinical hypothyroidism 07/17/2018   Infertility associated with anovulation 07/17/2018   Essential hypertension 07/17/2018   Cholelithiases - asymptomatic 07/17/2018   PCOS (polycystic ovarian syndrome)     PCP: Willow Ora, MD  REFERRING PROVIDER: Willow Ora, MD  REFERRING DIAG: [phone] (ICD-10-CM) - Sciatica, left side  Rationale for Evaluation and Treatment: Rehabilitation  THERAPY DIAG:  Other low back pain  Muscle weakness (generalized)  Pain in left hip  ONSET DATE: August 2024  SUBJECTIVE:                                                                                                                                                                                            SUBJECTIVE STATEMENT: States she has a sciatica after she was pregnant. States she would have pain in her leg around her period and when she would stand a lot but then it would go away. State sin August she changed her job and she had pain from her hip and down her leg. States this didn't go away. States that she has been taking the medication and  its helping but still heaviness and there feels like there is something wrong. States she can't sleep on the left side due to pain.   States that her right knee used to give out after her and she doesn't know if that contributed to the left hip. States she has not worked out  prior to her job and now very busy. Certain motions would cause the pinch and pain in the hip and leg.  Had difficulty bending forward.   Walking almost 4 miles- is no longer able to walk - moved and now she has a new home and has been very busy with that.    PERTINENT HISTORY:  HTN, pre-diabetic, c-section  PAIN:  Are you having pain? Yes: NPRS scale: 7/10 Pain location: buttocks and back of leg and back of leg Pain description: sharp, achy.  Aggravating factors: laying on left side and standing, sitting, getting her period Relieving factors: massage, medication  PRECAUTIONS: None  RED FLAGS: None   WEIGHT BEARING RESTRICTIONS: No  FALLS:  Has patient fallen in last 6 months? No   OCCUPATION: works sitting mostly at   [employer]: Independent  PATIENT GOALS: to have less pain and sleep better     OBJECTIVE:  Note: Objective measures were completed at Evaluation unless otherwise noted.  DIAGNOSTIC FINDINGS:  None at this time  PATIENT SURVEYS:  FOTO 55%  SCREENING FOR RED FLAGS: Bowel or bladder incontinence: No Spinal tumors: No Cauda equina syndrome: No Compression fracture: No Abdominal aneurysm:  No  COGNITION: Overall cognitive status: Within functional limits for tasks assessed     SENSATION: Not tested     POSTURE: rounded shoulders, posterior pelvic tilt, and weight shift right  PALPATION: Increased resting tone and pain in right glutes/lumbar musculature and hips, pitting edema B 2+  LUMBAR ROM:   AROM eval  Flexion WFL  Extension WFL  Right lateral flexion 50% limited*  Left lateral flexion 50% limited*  Right rotation   Left rotation    (Blank rows = not tested)  * Pain   LE Measurements Lower Extremity Right EVAL Left EVAL   A/PROM MMT A/PROM MMT  Hip Flexion WFL 4+ WFL 4  Hip Extension 10 4- 10 3+  Hip Abduction      Hip Adduction      Hip Internal rotation 45  40   Hip External rotation 60  60   Knee Flexion  4-  3+  Knee Extension  4  4-  Ankle Dorsiflexion  4  3+  Ankle Plantarflexion      Ankle Inversion      Ankle Eversion       (Blank rows = not tested) * pain   LUMBAR SPECIAL TESTS:   Left + slump, neg SLR B, neg ely's B   TODAY'S TREATMENT:  DATE:   04/04/2023  Therapeutic Exercise:  See education below: Supine: Prone:  Seated: piriformis stretch x2 30" holds B  Standing: Neuromuscular Re-education: Manual Therapy:IASTM with percussion gun and cushion to pelvis - tolerated well 10 minutes Therapeutic Activity: Self Care: Trigger Point Dry Needling:  Modalities:    PATIENT EDUCATION:  Education details: on current presentation, on HEP, on clinical outcomes score and POC, on benefits of compression garments, on causes on general information about her pre-diabetes(avoiding excess sugar/fake sugar), on sitting postures, on walking and integrating walking back into schedule  Person educated: Patient Education method: Explanation, Demonstration, and Handouts Education comprehension: verbalized  understanding   HOME EXERCISE PROGRAM: [id number]  ASSESSMENT:  CLINICAL IMPRESSION: Patient presents to physical therapy with complaints of left lower extremity pain that is worse in weightbearing but has recently improved with recent medication provided by MD.  Patient presents with limitations in strength, range of motion and overall function which is likely contributing to current presentation.  Patient would greatly benefit from physical therapy to reduce risk of further injury and improve overall quality of life  OBJECTIVE IMPAIRMENTS: decreased activity tolerance, difficulty walking, decreased ROM, decreased strength, improper body mechanics, postural dysfunction, and pain.   ACTIVITY LIMITATIONS: sitting, standing, and sleeping  PARTICIPATION LIMITATIONS: meal prep, cleaning, and community activity  PERSONAL FACTORS: Fitness and Profession are also affecting patient's functional outcome.   REHAB POTENTIAL: Good  CLINICAL DECISION MAKING: Stable/uncomplicated  EVALUATION COMPLEXITY: Low   GOALS: Goals reviewed with patient? yes  SHORT TERM GOALS: Target date: 05/16/2023   Patient will be independent in self management strategies to improve quality of life and functional outcomes. Baseline: New Program Goal status: INITIAL  2.  Patient will report at least 50% improvement in overall symptoms and/or function to demonstrate improved functional mobility Baseline: 0% better Goal status: INITIAL  3.   Patient will demonstrate pain-free lumbar range of motion's to improve gross mobility in back. Baseline: Painful Goal status: INITIAL     LONG TERM GOALS: Target date: 06/27/2023    Patient will report at least 75% improvement in overall symptoms and/or function to demonstrate improved functional mobility Baseline: 0% better Goal status: INITIAL  2.  Patient will improve score on FOTO outcomes measure to projected score to demonstrate overall improved function and  QOL Baseline: see above Goal status: INITIAL  3.  Patient will report walking at least 2 miles a day to return to prior level of function and improve overall activity during the day. Baseline: Not currently Goal status: INITIAL  4.  Patient will report no radicular symptoms in left lower extremity over the course of 1 week to improve quality of life Baseline: Not currently Goal status: INITIAL   PLAN:  PT FREQUENCY: 1x/week for a total of 12 visits over 12 week certification period  PT DURATION: 12 weeks  PLANNED INTERVENTIONS: Therapeutic exercises, Therapeutic activity, Neuromuscular re-education, Balance training, Gait training, Patient/Family education, Self Care, Joint mobilization, Joint manipulation, Stair training, Vestibular training, Canalith repositioning, Orthotic/Fit training, Prosthetic training, DME instructions, Aquatic Therapy, Dry Needling, Electrical stimulation, Spinal manipulation, Spinal mobilization, Cryotherapy, Moist heat, Taping, Traction, Ultrasound, Ionotophoresis 4mg /ml Dexamethasone, Manual therapy, and Re-evaluation.   PLAN FOR NEXT SESSION:  lymph drainage, compression garments f/u, sitting posture, breathing Mechanics, LE hip ROM and MMT   11:07 AM, 04/04/23 Tereasa Coop, DPT Physical Therapy with [facility]

## 2023-04-04 ENCOUNTER — Ambulatory Visit: Payer: BC Managed Care – PPO | Admitting: Physical Therapy

## 2023-04-04 ENCOUNTER — Encounter: Payer: Self-pay | Admitting: Physical Therapy

## 2023-04-04 DIAGNOSIS — M5459 Other low back pain: Secondary | ICD-10-CM | POA: Diagnosis not present

## 2023-04-04 DIAGNOSIS — M25552 Pain in left hip: Secondary | ICD-10-CM | POA: Diagnosis not present

## 2023-04-04 DIAGNOSIS — M6281 Muscle weakness (generalized): Secondary | ICD-10-CM | POA: Diagnosis not present

## 2023-04-12 ENCOUNTER — Encounter: Payer: Self-pay | Admitting: Family Medicine

## 2023-04-13 ENCOUNTER — Encounter: Payer: Self-pay | Admitting: Physical Therapy

## 2023-04-13 ENCOUNTER — Ambulatory Visit: Payer: BC Managed Care – PPO | Admitting: Physical Therapy

## 2023-04-13 DIAGNOSIS — M6281 Muscle weakness (generalized): Secondary | ICD-10-CM | POA: Diagnosis not present

## 2023-04-13 DIAGNOSIS — M25552 Pain in left hip: Secondary | ICD-10-CM

## 2023-04-13 DIAGNOSIS — M5459 Other low back pain: Secondary | ICD-10-CM

## 2023-04-13 NOTE — Therapy (Addendum)
OUTPATIENT PHYSICAL THERAPY THORACOLUMBAR TREATMENT PHYSICAL THERAPY DISCHARGE SUMMARY  Visits from Start of Care: 2  Current functional level related to goals / functional outcomes: Could not Reassess due to unplanned Discharge   Remaining deficits: Could not Reassess due to unplanned Discharge   Education / Equipment: Could not Reassess due to unplanned Discharge   Patient agrees to discharge. Patient goals were not met. Patient is being discharged due to not returning since the last visit.  10:34 AM, 06/08/23 Tereasa Coop, DPT Physical Therapy with [facility]    Patient Name: Angelica Harris MRN: [id number] DOB:09-05-1985, 37 y.o., female Today's Date: 04/13/2023  END OF SESSION:  PT End of Session - 04/13/23 0931     Visit Number 2    Number of Visits 12    Date for PT Re-Evaluation 06/27/23    Authorization Type VL [id number] copay    Authorization - Visit Number 2    Authorization - Number of Visits 60    Progress Note Due on Visit 10    PT Start Time 0933    PT Stop Time 1016    PT Time Calculation (min) 43 min    Activity Tolerance Patient tolerated treatment well    Behavior During Therapy [facility] for tasks assessed/performed             Past Medical History:  Diagnosis Date   Cervical incompetence    Cholelithiases - asymptomatic 07/17/2018   By ultrasound   Essential hypertension 07/17/2018   History of cervical cerclage 02/26/2021   History of cervical incompetence 02/26/2021   History of severe pre-eclampsia 02/26/2021   Hypertension    Infertility associated with anovulation    PCOS (polycystic ovarian syndrome)    Subclinical hypothyroidism 07/17/2018   Past Surgical History:  Procedure Laterality Date   CERVICAL CERCLAGE  2014   CESAREAN SECTION  2014   Patient Active Problem List   Diagnosis Date Noted   Prediabetes 12/01/2020   Varicose veins of both lower extremities with pain 08/25/2020   Subclinical hypothyroidism  07/17/2018   Infertility associated with anovulation 07/17/2018   Essential hypertension 07/17/2018   Cholelithiases - asymptomatic 07/17/2018   PCOS (polycystic ovarian syndrome)     PCP: Willow Ora, MD  REFERRING PROVIDER: Willow Ora, MD  REFERRING DIAG: [phone] (ICD-10-CM) - Sciatica, left side  Rationale for Evaluation and Treatment: Rehabilitation  THERAPY DIAG:  Other low back pain  Muscle weakness (generalized)  Pain in left hip  ONSET DATE: August 2024  SUBJECTIVE:  SUBJECTIVE STATEMENT: 04/13/2023 Has been doing her exercises and feeling better. Started walking but not consistently. Did not buy compression garments as she didn't know the size to get  EVAL: States she has a sciatica after she was pregnant. States she would have pain in her leg around her period and when she would stand a lot but then it would go away. State sin August she changed her job and she had pain from her hip and down her leg. States this didn't go away. States that she has been taking the medication and its helping but still heaviness and there feels like there is something wrong. States she can't sleep on the left side due to pain.   States that her right knee used to give out after her and she doesn't know if that contributed to the left hip. States she has not worked out  prior to her job and now very busy. Certain motions would cause the pinch and pain in the hip and leg.  Had difficulty bending forward.   Walking almost 4 miles- is no longer able to walk - moved and now she has a new home and has been very busy with that.    PERTINENT HISTORY:  HTN, pre-diabetic, c-section  PAIN:  Are you having pain? Yes: NPRS scale: 0/10 Pain location: buttocks and back of leg and back of leg Pain  description: sharp, achy.  Aggravating factors: laying on left side and standing, sitting, getting her period Relieving factors: massage, medication  PRECAUTIONS: None  RED FLAGS: None   WEIGHT BEARING RESTRICTIONS: No  FALLS:  Has patient fallen in last 6 months? No   OCCUPATION: works sitting mostly at   [employer]: Independent  PATIENT GOALS: to have less pain and sleep better     OBJECTIVE:  Note: Objective measures were completed at Evaluation unless otherwise noted.  DIAGNOSTIC FINDINGS:  None at this time  PATIENT SURVEYS:  FOTO 55%  SCREENING FOR RED FLAGS: Bowel or bladder incontinence: No Spinal tumors: No Cauda equina syndrome: No Compression fracture: No Abdominal aneurysm: No  COGNITION: Overall cognitive status: Within functional limits for tasks assessed     SENSATION: Not tested     POSTURE: rounded shoulders, posterior pelvic tilt, and weight shift right  PALPATION: Increased resting tone and pain in right glutes/lumbar musculature and hips, pitting edema B 2+  LUMBAR ROM:   AROM eval  Flexion WFL  Extension WFL  Right lateral flexion 50% limited*  Left lateral flexion 50% limited*  Right rotation   Left rotation    (Blank rows = not tested)  * Pain   LE Measurements Lower Extremity Right EVAL Left EVAL   A/PROM MMT A/PROM MMT  Hip Flexion WFL 4+ WFL 4  Hip Extension 10 4- 10 3+  Hip Abduction      Hip Adduction      Hip Internal rotation 45  40   Hip External rotation 60  60   Knee Flexion  4-  3+  Knee Extension  4  4-  Ankle Dorsiflexion  4  3+  Ankle Plantarflexion      Ankle Inversion      Ankle Eversion       (Blank rows = not tested) * pain   LUMBAR SPECIAL TESTS:   Left + slump, neg SLR B, neg ely's B   TODAY'S TREATMENT:  DATE:   04/13/2023  Therapeutic Exercise:  See education  below: Supine: bridges 3x5 5" holds, hamstring stretch 2x5 10" holds B with DF long exhale breathing  Prone:  Seated: piriformis stretch x2 30" holds B in both ER and IR   Standing: Neuromuscular Re-education: Long exhale breathing verbal and tactile cues with prior demonstration 10 minutes Manual Therapy:IASTM with percussion gun and cushion to thoracic spine and shoulder region- tolerated moderately well 10 minutes Therapeutic Activity: Self Care: Trigger Point Dry Needling:  Modalities: thermotherapy to cervical spine and left shoulder during supine interventions   PATIENT EDUCATION:  Education details: on HEP, compression sizing and current measurements (measured patient in clinic) Person educated: Patient Education method: Explanation, Demonstration, and Handouts Education comprehension: verbalized understanding   HOME EXERCISE PROGRAM: [id number]  ASSESSMENT:  CLINICAL IMPRESSION: 04/13/2023 Session focused on review of home exercise program as well as progression of exercises.  Answered all questions and educated patient on importance of posture and breathing mechanics. Difficult for patient to perform long exhale with use of core/diaphragm. Will review and progress this in future sessions.   Eval: Patient presents to physical therapy with complaints of left lower extremity pain that is worse in weightbearing but has recently improved with recent medication provided by MD.  Patient presents with limitations in strength, range of motion and overall function which is likely contributing to current presentation.  Patient would greatly benefit from physical therapy to reduce risk of further injury and improve overall quality of life  OBJECTIVE IMPAIRMENTS: decreased activity tolerance, difficulty walking, decreased ROM, decreased strength, improper body mechanics, postural dysfunction, and pain.   ACTIVITY LIMITATIONS: sitting, standing, and sleeping  PARTICIPATION LIMITATIONS:  meal prep, cleaning, and community activity  PERSONAL FACTORS: Fitness and Profession are also affecting patient's functional outcome.   REHAB POTENTIAL: Good  CLINICAL DECISION MAKING: Stable/uncomplicated  EVALUATION COMPLEXITY: Low   GOALS: Goals reviewed with patient? yes  SHORT TERM GOALS: Target date: 05/16/2023   Patient will be independent in self management strategies to improve quality of life and functional outcomes. Baseline: New Program Goal status: INITIAL  2.  Patient will report at least 50% improvement in overall symptoms and/or function to demonstrate improved functional mobility Baseline: 0% better Goal status: INITIAL  3.   Patient will demonstrate pain-free lumbar range of motion's to improve gross mobility in back. Baseline: Painful Goal status: INITIAL     LONG TERM GOALS: Target date: 06/27/2023    Patient will report at least 75% improvement in overall symptoms and/or function to demonstrate improved functional mobility Baseline: 0% better Goal status: INITIAL  2.  Patient will improve score on FOTO outcomes measure to projected score to demonstrate overall improved function and QOL Baseline: see above Goal status: INITIAL  3.  Patient will report walking at least 2 miles a day to return to prior level of function and improve overall activity during the day. Baseline: Not currently Goal status: INITIAL  4.  Patient will report no radicular symptoms in left lower extremity over the course of 1 week to improve quality of life Baseline: Not currently Goal status: INITIAL   PLAN:  PT FREQUENCY: 1x/week for a total of 12 visits over 12 week certification period  PT DURATION: 12 weeks  PLANNED INTERVENTIONS: Therapeutic exercises, Therapeutic activity, Neuromuscular re-education, Balance training, Gait training, Patient/Family education, Self Care, Joint mobilization, Joint manipulation, Stair training, Vestibular training, Canalith  repositioning, Orthotic/Fit training, Prosthetic training, DME instructions, Aquatic Therapy, Dry Needling, Electrical stimulation, Spinal manipulation,  Spinal mobilization, Cryotherapy, Moist heat, Taping, Traction, Ultrasound, Ionotophoresis 4mg /ml Dexamethasone, Manual therapy, and Re-evaluation.   PLAN FOR NEXT SESSION:  l compression garments, start with percussion gun to hips and low back, follow-up with long exhale breathing, ymph drainage, compression garments f/u, sitting posture, breathing Mechanics, LE hip ROM and MMT   11:46 AM, 04/13/23 Tereasa Coop, DPT Physical Therapy with [facility]

## 2023-04-14 ENCOUNTER — Encounter: Payer: Self-pay | Admitting: Family Medicine

## 2023-04-14 ENCOUNTER — Ambulatory Visit: Payer: BC Managed Care – PPO | Admitting: Family Medicine

## 2023-04-14 ENCOUNTER — Other Ambulatory Visit (HOSPITAL_COMMUNITY)
Admission: RE | Admit: 2023-04-14 | Discharge: 2023-04-14 | Disposition: A | Discharge status: Home / Self Care | Payer: BC Managed Care – PPO | Source: Ambulatory Visit | Attending: Family Medicine | Admitting: Family Medicine

## 2023-04-14 VITALS — BP 114/76 | HR 107 | Temp 98.6°F | Ht 63.0 in | Wt 143.0 lb

## 2023-04-14 DIAGNOSIS — N3000 Acute cystitis without hematuria: Secondary | ICD-10-CM

## 2023-04-14 DIAGNOSIS — N898 Other specified noninflammatory disorders of vagina: Secondary | ICD-10-CM

## 2023-04-14 DIAGNOSIS — M5432 Sciatica, left side: Secondary | ICD-10-CM

## 2023-04-14 NOTE — Progress Notes (Signed)
Subjective  CC:  Chief Complaint  Patient presents with   Vaginitis    Pt stated that she has a foul fishy odor after intercourse     HPI: Angelica Harris is a 37 y.o. female who presents to the office today to address the problems listed above in the chief complaint. Patient presents for evaluation of an abnormal vaginal discharge: she describes  thin and malodorous without itching, pelvic pain or sores. Sexually transmitted infection risk: Very low risk of STD exposure. The patient denies history of sexually transmitted disease.. She declines serology testing and HIV testing today. She denies urinary sxs.  She was here several weeks ago with urinary symptoms, E. coli UTI was diagnosed but she never used the antibiotics.  Her urinary symptoms did resolve.  She also had some vaginal itching which resolved.  She feels well. Sciatica: Improved with medications and a few courses of physical therapy.  She prefers not to continue with physical therapy due to cost.  See last note  I reviewed the patients updated PMH, FH, and SocHx.    Patient Active Problem List   Diagnosis Date Noted   Prediabetes 12/01/2020    Priority: High   Subclinical hypothyroidism 07/17/2018    Priority: High   Essential hypertension 07/17/2018    Priority: High   Infertility associated with anovulation 07/17/2018    Priority: Medium    Cholelithiases - asymptomatic 07/17/2018    Priority: Medium    PCOS (polycystic ovarian syndrome)     Priority: Medium    Varicose veins of both lower extremities with pain 08/25/2020    Priority: Low   Current Meds  Medication Sig   amLODipine (NORVASC) 5 MG tablet Take 1 tablet (5 mg total) by mouth daily.   diclofenac (VOLTAREN) 75 MG EC tablet Take 1 tablet (75 mg total) by mouth 2 (two) times daily.   fluconazole (DIFLUCAN) 150 MG tablet Take one tablet today; may repeat in 3 days if symptoms persist   levothyroxine (SYNTHROID) 25 MCG tablet TAKE 1 TABLET  DAILY   metFORMIN (GLUCOPHAGE) 500 MG tablet TAKE 1 TABLET TWICE A DAY WITH MEALS   Multiple Vitamin (MULTIVITAMIN WITH MINERALS) TABS tablet Take 1 tablet by mouth daily.   nitrofurantoin, macrocrystal-monohydrate, (MACROBID) 100 MG capsule Take 1 capsule (100 mg total) by mouth 2 (two) times daily.   telmisartan-hydrochlorothiazide (MICARDIS HCT) 80-25 MG tablet Take 1 tablet by mouth daily.    Allergies: Patient is allergic to ace inhibitors and labetalol hcl. Family History: Patient family history includes Diabetes in her father; Healthy in her daughter; Hypertension in her mother; Thyroid disease in her father. Social History:  Patient  reports that she has never smoked. She has never used smokeless tobacco. She reports that she does not drink alcohol and does not use drugs.  Review of Systems: Constitutional: Negative for fever malaise or anorexia Cardiovascular: negative for chest pain Respiratory: negative for SOB or persistent cough Gastrointestinal: negative for abdominal pain  Objective  Vitals: BP 114/76   Pulse (!) 107   Temp 98.6 F (37 C)   Ht 5\' 3"  (1.6 m)   Wt 143 lb (64.9 kg)   SpO2 97%   BMI 25.33 kg/m  General: no acute distress , A&Ox3   Assessment  1. Vaginal discharge   2. Acute cystitis without hematuria   3. Sciatica, left side      Plan  Vaginal discharge: Clinically most consistent with bacterial vaginosis.  Education given.  Await vaginal swab testing.  Will treat accordingly. History of acute cystitis clinically resolved without antibiotics.  Recheck urine culture today Sciatica is improved.  I recommend continuing home stretching.  Follow up: As needed  Commons side effects, risks, benefits, and alternatives for medications and treatment plan prescribed today were discussed, and the patient expressed understanding of the given instructions. Patient is instructed to call or message via MyChart if he/she has any questions or concerns regarding  our treatment plan. No barriers to understanding were identified. We discussed Red Flag symptoms and signs in detail. Patient expressed understanding regarding what to do in case of urgent or emergency type symptoms.  Medication list was reconciled, printed and provided to the patient in AVS. Patient instructions and summary information was reviewed with the patient as documented in the AVS. This note was prepared with assistance of Dragon voice recognition software. Occasional wrong-word or sound-a-like substitutions may have occurred due to the inherent limitations of voice recognition software  Orders Placed This Encounter  Procedures   Urine Culture   No orders of the defined types were placed in this encounter.

## 2023-04-15 LAB — CERVICOVAGINAL ANCILLARY ONLY
Bacterial Vaginitis (gardnerella): POSITIVE — AB
Candida Glabrata: NEGATIVE
Candida Vaginitis: NEGATIVE
Comment: NEGATIVE
Comment: NEGATIVE
Comment: NEGATIVE
Comment: NEGATIVE
Trichomonas: NEGATIVE

## 2023-04-15 MED ORDER — METRONIDAZOLE 500 MG PO TABS: 500.0000 mg | ORAL_TABLET | Freq: Two times a day (BID) | ORAL | 0 refills | Status: AC

## 2023-04-15 NOTE — Addendum Note (Signed)
Addended by: Asencion Partridge on: 04/15/2023 12:11 PM   Modules accepted: Orders

## 2023-04-15 NOTE — Progress Notes (Signed)
See mychart note Dear Ms. Noyola, Your testing returned positive for BV as we discussed. I have sent in an antibiotic to take that should cure it.   Sincerely, Dr. Mardelle Matte

## 2023-04-19 ENCOUNTER — Encounter: Payer: Self-pay | Admitting: Family Medicine

## 2023-04-20 ENCOUNTER — Encounter: Payer: BC Managed Care – PPO | Admitting: Physical Therapy

## 2023-04-25 ENCOUNTER — Encounter: Payer: Self-pay | Admitting: Family Medicine

## 2023-04-27 ENCOUNTER — Encounter: Payer: BC Managed Care – PPO | Admitting: Physical Therapy

## 2023-07-11 ENCOUNTER — Other Ambulatory Visit: Payer: Self-pay | Admitting: Family Medicine

## 2023-08-15 ENCOUNTER — Ambulatory Visit: Payer: BC Managed Care – PPO | Admitting: Family Medicine

## 2023-08-17 ENCOUNTER — Ambulatory Visit: Payer: BC Managed Care – PPO | Admitting: Family Medicine

## 2023-10-04 ENCOUNTER — Encounter: Payer: Self-pay | Admitting: Family Medicine

## 2023-10-04 ENCOUNTER — Ambulatory Visit: Payer: BC Managed Care – PPO | Admitting: Family Medicine

## 2023-10-04 VITALS — BP 124/88 | HR 90 | Temp 98.1°F | Ht 63.0 in | Wt 144.0 lb

## 2023-10-04 DIAGNOSIS — J301 Allergic rhinitis due to pollen: Secondary | ICD-10-CM | POA: Diagnosis not present

## 2023-10-04 DIAGNOSIS — L739 Follicular disorder, unspecified: Secondary | ICD-10-CM | POA: Diagnosis not present

## 2023-10-04 DIAGNOSIS — M5432 Sciatica, left side: Secondary | ICD-10-CM | POA: Diagnosis not present

## 2023-10-04 DIAGNOSIS — I10 Essential (primary) hypertension: Secondary | ICD-10-CM

## 2023-10-04 MED ORDER — FLUCONAZOLE 150 MG PO TABS: ORAL_TABLET | ORAL | 0 refills | Status: DC

## 2023-10-04 MED ORDER — DOXYCYCLINE HYCLATE 100 MG PO TABS: 100.0000 mg | ORAL_TABLET | Freq: Two times a day (BID) | ORAL | 0 refills | Status: AC

## 2023-10-04 NOTE — Patient Instructions (Signed)
 Please follow up as scheduled for your next visit with me: 11/23/2023   If you have any questions or concerns, please don't hesitate to send me a message via MyChart or call the office at [phone]. Thank you for visiting with [place] today! It's our pleasure caring for you.   VISIT SUMMARY:  Today, you came in for a follow-up on your blood pressure management and to address a skin irritation. We also discussed your recent trip to [place], where you experienced a viral illness, and your ongoing sciatic pain. Additionally, we reviewed your occasional cough and throat clearing, which you believe is due to post-nasal drainage.  YOUR PLAN:  -FOLLICULITIS: Folliculitis is an infection of the hair follicles, which can cause tender, red areas on the skin. This may have been caused by your pet's scratch. We will prescribe antibiotics and an antifungal to prevent a yeast infection. Please apply warm compresses to the affected area.  -HYPERTENSION: Hypertension is high blood pressure. Your blood pressure was slightly elevated today, but no changes to your medication are needed at this time. Please continue to monitor your blood pressure, and we will consider increasing your amlodipine if it remains elevated at your next visit.  -SCIATICA: Sciatica is pain that radiates along the path of the sciatic nerve, often caused by prolonged sitting or poor posture. We recommend you continue your home exercises and stretches, maintain proper posture, and consider using a standing desk or adjustable platform. Walking regularly can also help manage the pain.  -ALLERGIC RHINITIS: Allergic rhinitis is an allergic reaction that causes sneezing, congestion, and post-nasal drainage. Your symptoms seem to be worse at night. We recommend taking Zyrtec at night for several weeks and considering Flonase if needed.  -GENERAL HEALTH MAINTENANCE: As a vegetarian, it's important to monitor your vitamin B12 and D levels. Your current  multivitamin does not provide enough vitamin D. We will check your vitamin B12 and D levels at your next physical and recommend you take a vitamin D supplement of 1000 units daily.  INSTRUCTIONS:  Please monitor your blood pressure regularly and keep a log of your readings. Follow the prescribed treatment for your skin irritation and apply warm compresses. Continue your home exercises and stretches for sciatica, and consider using a standing desk. Take Zyrtec at night for your post-nasal drainage, and consider Flonase if needed. We will check your vitamin B12 and D levels at your next physical, so please ensure you are taking a vitamin D supplement of 1000 units daily.

## 2023-10-04 NOTE — Progress Notes (Signed)
 Subjective  CC:  Chief Complaint  Patient presents with   Hypertension   boil on vagina    Pt stated that I has been there for the past 2 weeks and it is painful.     HPI: Angelica Harris is a 38 y.o. female who presents to the office today to address the problems listed above in the chief complaint. Discussed the use of AI scribe software for clinical note transcription with the patient, who gave verbal consent to proceed. History of Present Illness Pt presents for blood pressure management and skin irritation.  She is here for a follow-up on her blood pressure management. Her blood pressure readings have been normal, but today's reading was slightly elevated. She is currently taking amlodipine and arb/hct combo for hypertension.  She has a tender area on her skin in the upper perineum that developed approximately two weeks ago after her pet scratched her. She has been applying Neosporin, which has reduced the irritation, but the area remains tender. No drainage, fever, or discharge from the site. Has a small boil. No systemic sxs: fever nor malaise.   She recently returned from a month-long trip to [place], where she experienced a viral illness with a cough about two and a half weeks ago. The cough has mostly resolved, but she still experiences occasional throat clearing and cough, particularly at night, which she attributes to post-nasal drainage. No sneezing and is not currently taking any allergy medications.  She mentions ongoing sciatic pain, which improved with massages during her trip to [place] but has worsened since returning. She attributes the pain to prolonged sitting at work and is performing exercises at home. She has not pursued further physical therapy due to cost. See notes from last year. Has had PT.   Her current medications include amlodipine for hypertension, thyroid medication, and metformin. She is a vegetarian and takes a multivitamin from [employer], which  includes 400 units of vitamin D. History of Present Illness    Assessment  1. Folliculitis of perineum   2. Seasonal allergic rhinitis due to pollen   3. Essential hypertension   4. Sciatica, left side      Plan  Assessment and Plan Assessment & Plan Assessment and Plan Folliculitis and small boil: No drainage or fever. - Prescribe antibiotics. Doxy 100 bid x7d - Prescribe diflucan to use if needed for yeast infection. - Apply warm compresses.  Hypertension Slightly elevated blood pressure with diastolic at 88. No treatment change now, consider adjusting dose of amlodipine increase if persistent. - Monitor blood pressure. - Consider amlodipine increase if elevated next visit.  Sciatica Chronic sciatic pain, improved with massages, likely from prolonged sitting and poor posture. Emphasized proper posture and lumbar support. - Encourage walking routine. - Advise on proper sitting posture and lumbar support. - Recommend standing desk or adjustable platform. - Continue home exercises and stretches. -will reassess at next visit: to sports med if not improving.  Allergic Rhinitis Intermittent cough and throat clearing likely from post-nasal drainage. Symptoms worse at night. - Recommend Zyrtec at night for several weeks. - Consider Flonase.  General Health Maintenance Vegetarian diet may require monitoring of vitamin B12 and D. Current multivitamin insufficient in vitamin D. - Check vitamin B12 and D levels at next physical. - Recommend vitamin D supplement 1000 units daily.    No orders of the defined types were placed in this encounter.  Meds ordered this encounter  Medications   fluconazole (DIFLUCAN) 150 MG tablet  Sig: Take one tablet today; may repeat in 3 days if symptoms persist    Dispense:  2 tablet    Refill:  0   doxycycline (VIBRA-TABS) 100 MG tablet    Sig: Take 1 tablet (100 mg total) by mouth 2 (two) times daily for 7 days.    Dispense:  14 tablet     Refill:  0     I reviewed the patients updated PMH, FH, and SocHx.    Patient Active Problem List   Diagnosis Date Noted   Prediabetes 12/01/2020    Priority: High   Subclinical hypothyroidism 07/17/2018    Priority: High   Essential hypertension 07/17/2018    Priority: High   Infertility associated with anovulation 07/17/2018    Priority: Medium    Cholelithiases - asymptomatic 07/17/2018    Priority: Medium    PCOS (polycystic ovarian syndrome)     Priority: Medium    Varicose veins of both lower extremities with pain 08/25/2020    Priority: Low   Current Meds  Medication Sig   doxycycline (VIBRA-TABS) 100 MG tablet Take 1 tablet (100 mg total) by mouth 2 (two) times daily for 7 days.   fluconazole (DIFLUCAN) 150 MG tablet Take one tablet today; may repeat in 3 days if symptoms persist    Allergies: Patient is allergic to ace inhibitors and labetalol hcl. Family History: Patient family history includes Diabetes in her father; Healthy in her daughter; Hypertension in her mother; Thyroid disease in her father. Social History:  Patient  reports that she has never smoked. She has never used smokeless tobacco. She reports that she does not drink alcohol and does not use drugs.  Review of Systems: Constitutional: Negative for fever malaise or anorexia Cardiovascular: negative for chest pain Respiratory: negative for SOB or persistent cough Gastrointestinal: negative for abdominal pain  Objective  Vitals: BP 124/88   Pulse 90   Temp 98.1 F (36.7 C)   Ht 5\' 3"  (1.6 m)   Wt 144 lb (65.3 kg)   SpO2 100%   BMI 25.51 kg/m  General: no acute distress , A&Ox3 HEENT: PEERL, conjunctiva normal, neck is supple Cardiovascular:  RRR without murmur or gallop.  Respiratory:  Good breath sounds bilaterally, CTAB with normal respiratory effort Skin:  perineum with folliculitis changes and one small boil w/o fluctance, mild ttp.  Commons side effects, risks, benefits, and  alternatives for medications and treatment plan prescribed today were discussed, and the patient expressed understanding of the given instructions. Patient is instructed to call or message via MyChart if he/she has any questions or concerns regarding our treatment plan. No barriers to understanding were identified. We discussed Red Flag symptoms and signs in detail. Patient expressed understanding regarding what to do in case of urgent or emergency type symptoms.  Medication list was reconciled, printed and provided to the patient in AVS. Patient instructions and summary information was reviewed with the patient as documented in the AVS. This note was prepared with assistance of Dragon voice recognition software. Occasional wrong-word or sound-a-like substitutions may have occurred due to the inherent limitations of voice recognition software

## 2023-11-23 ENCOUNTER — Encounter: Admitting: Family Medicine

## 2023-12-20 ENCOUNTER — Other Ambulatory Visit: Payer: Self-pay | Admitting: Family Medicine

## 2024-01-05 ENCOUNTER — Encounter: Payer: Self-pay | Admitting: Family Medicine

## 2024-02-24 ENCOUNTER — Encounter: Payer: Self-pay | Admitting: Family Medicine

## 2024-02-24 ENCOUNTER — Ambulatory Visit: Admitting: Family Medicine

## 2024-02-24 VITALS — BP 118/78 | HR 91 | Temp 99.0°F | Ht 63.0 in | Wt 149.0 lb

## 2024-02-24 DIAGNOSIS — R7303 Prediabetes: Secondary | ICD-10-CM | POA: Diagnosis not present

## 2024-02-24 DIAGNOSIS — M25562 Pain in left knee: Secondary | ICD-10-CM

## 2024-02-24 DIAGNOSIS — Z0001 Encounter for general adult medical examination with abnormal findings: Secondary | ICD-10-CM

## 2024-02-24 DIAGNOSIS — M25561 Pain in right knee: Secondary | ICD-10-CM | POA: Diagnosis not present

## 2024-02-24 DIAGNOSIS — G8929 Other chronic pain: Secondary | ICD-10-CM

## 2024-02-24 DIAGNOSIS — I10 Essential (primary) hypertension: Secondary | ICD-10-CM | POA: Diagnosis not present

## 2024-02-24 DIAGNOSIS — E282 Polycystic ovarian syndrome: Secondary | ICD-10-CM

## 2024-02-24 DIAGNOSIS — E038 Other specified hypothyroidism: Secondary | ICD-10-CM

## 2024-02-24 DIAGNOSIS — N970 Female infertility associated with anovulation: Secondary | ICD-10-CM

## 2024-02-24 LAB — CBC WITH DIFFERENTIAL/PLATELET
Basophils Absolute: 0 K/uL (ref 0.0–0.1)
Basophils Relative: 0.5 % (ref 0.0–3.0)
Eosinophils Absolute: 0.1 K/uL (ref 0.0–0.7)
Eosinophils Relative: 1.8 % (ref 0.0–5.0)
HCT: 35.5 % — ABNORMAL LOW (ref 36.0–46.0)
Hemoglobin: 12.1 g/dL (ref 12.0–15.0)
Lymphocytes Relative: 21 % (ref 12.0–46.0)
Lymphs Abs: 1.6 K/uL (ref 0.7–4.0)
MCHC: 33.9 g/dL (ref 30.0–36.0)
MCV: 87.4 fl (ref 78.0–100.0)
Monocytes Absolute: 0.5 K/uL (ref 0.1–1.0)
Monocytes Relative: 7.1 % (ref 3.0–12.0)
Neutro Abs: 5.2 K/uL (ref 1.4–7.7)
Neutrophils Relative %: 69.6 % (ref 43.0–77.0)
Platelets: 307 K/uL (ref 150.0–400.0)
RBC: 4.06 Mil/uL (ref 3.87–5.11)
RDW: 12.9 % (ref 11.5–15.5)
WBC: 7.4 K/uL (ref 4.0–10.5)

## 2024-02-24 LAB — MICROALBUMIN / CREATININE URINE RATIO
Creatinine,U: 97.5 mg/dL
Microalb Creat Ratio: 16 mg/g (ref 0.0–30.0)
Microalb, Ur: 1.6 mg/dL (ref 0.0–1.9)

## 2024-02-24 LAB — LIPID PANEL
Cholesterol: 155 mg/dL (ref 0–200)
HDL: 41.7 mg/dL (ref >39.00)
LDL Cholesterol: 36 mg/dL (ref 0–99)
NonHDL: 113.4
Total CHOL/HDL Ratio: 4
Triglycerides: 385 mg/dL — ABNORMAL HIGH (ref 0.0–149.0)
VLDL: 77 mg/dL — ABNORMAL HIGH (ref 0.0–40.0)

## 2024-02-24 LAB — COMPREHENSIVE METABOLIC PANEL WITH GFR
ALT: 24 U/L (ref 0–35)
AST: 19 U/L (ref 0–37)
Albumin: 4.3 g/dL (ref 3.5–5.2)
Alkaline Phosphatase: 46 U/L (ref 39–117)
BUN: 12 mg/dL (ref 6–23)
CO2: 27 meq/L (ref 19–32)
Calcium: 9.1 mg/dL (ref 8.4–10.5)
Chloride: 100 meq/L (ref 96–112)
Creatinine, Ser: 0.68 mg/dL (ref 0.40–1.20)
GFR: 109.32 mL/min (ref >60.00)
Glucose, Bld: 159 mg/dL — ABNORMAL HIGH (ref 70–99)
Potassium: 4 meq/L (ref 3.5–5.1)
Sodium: 137 meq/L (ref 135–145)
Total Bilirubin: 0.3 mg/dL (ref 0.2–1.2)
Total Protein: 6.9 g/dL (ref 6.0–8.3)

## 2024-02-24 LAB — HEMOGLOBIN A1C: Hgb A1c MFr Bld: 7.9 % — ABNORMAL HIGH (ref 4.6–6.5)

## 2024-02-24 LAB — TSH: TSH: 2.02 u[IU]/mL (ref 0.35–5.50)

## 2024-02-24 NOTE — Patient Instructions (Signed)
 Please return in 12 months for your annual complete physical; please come fasting. For follow up on chronic medical conditions   I will release your lab results to you on your MyChart account with further instructions. You may see the results before I do, but when I review them I will send you a message with my report or have my assistant call you if things need to be discussed. Please reply to my message with any questions. Thank you!    I have referred you to sports medicine to discuss your knee pain. They will call you with an appointment.   If you have any questions or concerns, please don't hesitate to send me a message via MyChart or call the office at [phone]. Thank you for visiting with us  today! It's our pleasure caring for you.

## 2024-02-24 NOTE — Progress Notes (Signed)
 Subjective  Chief Complaint  Patient presents with   Annual Exam    Pt here for Annual Exam and is not currently fasting    Hypertension    HPI: Angelica Harris is a 38 y.o. female who presents to [facility] [place] today for a Female Wellness Visit.  She also has the concerns and/or needs as listed above in the chief complaint. These will be addressed in addition to the Health Maintenance Visit.   Wellness Visit: annual visit with health maintenance review and exam HM: pap due 2026. Healthy lifestyle. Doing well. Imms current. Eligible for flu vaccine.  She is nonfasting for lab work today. Chronic disease management visit and/or acute problem visit: HTN: Control is good on Micardis  HCT and amlodipine .  Tolerates medications well.  No chest pain or shortness of breath.  No side effects.  No lower extremity edema. History of subclinical hypothyroidism on levothyroxine  25 mcg daily.  Due for recheck. PCOS and history of prediabetes/near diabetes on metformin  100 twice daily.  Eats well.  No symptoms of hyperglycemia.  Due for recheck.  She is on ARB.  Not yet on a statin. Complains of bilateral knee pain right greater than left.  Ongoing for 11 years but intermittent.  Describes intermittent episodes of something like a ball on the lateral aspect of her knee that needs to be pushed back in.  Denies swelling or redness.  Has chronic left knee pain described as feeling heavy.  Has not had prior medical care for knee pain.  Would like a referral.  No locking or giveaway.  No injury.  No calf pain. History of infertility, still with regular cycles on metformin .  No need for birth control per OB.  Assessment  1. Encounter for well adult exam with abnormal findings   2. Essential hypertension   3. Subclinical hypothyroidism   4. PCOS (polycystic ovarian syndrome)   5. Prediabetes   6. Bilateral chronic knee pain   7. Infertility associated with anovulation       Plan  Female Wellness Visit: Age appropriate Health Maintenance and Prevention measures were discussed with patient. Included topics are cancer screening recommendations, ways to keep healthy (see AVS) including dietary and exercise recommendations, regular eye and dental care, use of seat belts, and avoidance of moderate alcohol use and tobacco use.  Screens are current BMI: discussed patient's BMI and encouraged positive lifestyle modifications to help get to or maintain a target BMI. HM needs and immunizations were addressed and ordered. See below for orders. See HM and immunization section for updates.  Recommend flu shot this season Routine labs and screening tests ordered including cmp, cbc and lipids where appropriate. Discussed recommendations regarding Vit D and calcium supplementation (see AVS)  Chronic disease f/u and/or acute problem visit: (deemed necessary to be done in addition to the wellness visit): Prediabetes/borderline diabetes: Education given.  Continue metformin  500 twice daily.  Monitor lab values.  Continue healthy diet.  If A1c elevates, will flip her into the diabetic category and will recommend eye exams, immunizations and statin. Hypertension is well-controlled.  Continue current medications and check renal function and electrolytes Knee pain: Refer to sports medicine for further clarification and evaluation of knee pain. History of subclinical hypothyroidism on levothyroxine  25 mcg daily.  Recheck TSH.  Clinically stable PCOS and history of infertility.  Metformin  to regulate cycles.  She is not on birth control.  Follow up: 1 year for complete physical and follow-up  Orders Placed This Encounter  Procedures   CBC with Differential/Platelet   Comprehensive metabolic panel with GFR   Lipid panel   Hemoglobin A1c   TSH   Microalbumin / creatinine urine ratio   Ambulatory referral to Sports Medicine   No orders of the defined types were placed in this  encounter.      Body mass index is 26.39 kg/m. Wt Readings from Last 3 Encounters:  02/24/24 149 lb (67.6 kg)  10/04/23 144 lb (65.3 kg)  04/14/23 143 lb (64.9 kg)    Patient Active Problem List   Diagnosis Date Noted   Prediabetes 12/01/2020    Priority: High    A1c max 5.4 Dx 10/2022, PCOS and metformin . Shared decision making with patient: will monitor closely.    Subclinical hypothyroidism 07/17/2018    Priority: High   Essential hypertension 07/17/2018    Priority: High    She has an ACE inhibitor cough.  She has used nifedipine  in the past which caused hair thinning.  Has also been on amlodipine  which she says she tolerated.  Was on labetalol  which she felt fatigued and dizzy in the past.  She was switched off amlodipine  and change to nifedipine  when she was trying to conceive. Restarted amlodipine  May 2024 in addition to Micardis  HCT to get better control.    Infertility associated with anovulation 07/17/2018    Priority: Medium    Cholelithiases - asymptomatic 07/17/2018    Priority: Medium     By ultrasound    PCOS (polycystic ovarian syndrome)     Priority: Medium    Varicose veins of both lower extremities with pain 08/25/2020    Priority: Low   Health Maintenance  Topic Date Due   Hepatitis B Vaccines 19-59 Average Risk (1 of 3 - 19+ 3-dose series) Never done   HPV VACCINES (1 - 3-dose SCDM series) Never done   INFLUENZA VACCINE  01/27/2024   COVID-19 Vaccine (3 - 2024-25 season) 03/11/2024 (Originally 02/27/2023)   Cervical Cancer Screening (HPV/Pap Cotest)  06/02/2025   DTaP/Tdap/Td (2 - Td or Tdap) 11/08/2032   Hepatitis C Screening  Completed   HIV Screening  Completed   Pneumococcal Vaccine  Aged Out   Meningococcal B Vaccine  Aged Out   Immunization History  Administered Date(s) Administered   Influenza,inj,Quad PF,6+ Mos 03/23/2019, 03/10/2022   Influenza-Unspecified 05/03/2020   Moderna Sars-Covid-2 Vaccination 09/13/2019, 10/11/2019   Tdap  11/09/2022   We updated and reviewed the patient's past history in detail and it is documented below. Allergies: Patient  reports no history of alcohol use. Past Medical History Patient  has a past medical history of Cervical incompetence, Cholelithiases - asymptomatic (07/17/2018), Essential hypertension (07/17/2018), History of cervical cerclage (02/26/2021), History of cervical incompetence (02/26/2021), History of severe pre-eclampsia (02/26/2021), Hypertension, Infertility associated with anovulation, PCOS (polycystic ovarian syndrome), and Subclinical hypothyroidism (07/17/2018). Past Surgical History Patient  has a past surgical history that includes Cervical cerclage (2014) and Cesarean section (2014). Social History   Socioeconomic History   Marital status: Married    Spouse name: Not on file   Number of children: 1   Years of education: Not on file   Highest education level: Not on file  Occupational History   Occupation: stay at home mom  Tobacco Use   Smoking status: Never   Smokeless tobacco: Never  Vaping Use   Vaping status: Never Used  Substance and Sexual Activity   Alcohol use: Never   Drug use: Never  Sexual activity: Yes    Birth control/protection: None  Other Topics Concern   Not on file  Social History Narrative   Not on file   Social Drivers of Health   Financial Resource Strain: Not on file  Food Insecurity: Not on file  Transportation Needs: Not on file  Physical Activity: Not on file  Stress: Not on file  Social Connections: Not on file   Family History  Problem Relation Age of Onset   Hypertension Mother    Diabetes Father    Thyroid disease Father    Healthy Daughter     Review of Systems: Constitutional: negative for fever or malaise Ophthalmic: negative for photophobia, double vision or loss of vision Cardiovascular: negative for chest pain, dyspnea on exertion, or new LE swelling Respiratory: negative for SOB or persistent  cough Gastrointestinal: negative for abdominal pain, change in bowel habits or melena Genitourinary: negative for dysuria or gross hematuria, no abnormal uterine bleeding or disharge Musculoskeletal: negative for new gait disturbance or muscular weakness Integumentary: negative for new or persistent rashes, no breast lumps Neurological: negative for TIA or stroke symptoms Psychiatric: negative for SI or delusions Allergic/Immunologic: negative for hives  Patient Care Team    Relationship Specialty Notifications Start End  Jodie Lavern CROME, MD PCP - General Family Medicine  07/17/18     Objective  Vitals: BP 118/78   Pulse 91   Temp 99 F (37.2 C)   Ht 5' 3 (1.6 m)   Wt 149 lb (67.6 kg)   SpO2 99%   BMI 26.39 kg/m  General:  Well developed, well nourished, no acute distress  Psych:  Alert and orientedx3,normal mood and affect HEENT:  Normocephalic, atraumatic, non-icteric sclera, PERRL, supple neck without adenopathy, mass or thyromegaly Cardiovascular:  Normal S1, S2, RRR without gallop, rub or murmur Respiratory:  Good breath sounds bilaterally, CTAB with normal respiratory effort Gastrointestinal: normal bowel sounds, soft, non-tender, no noted masses. No HSM MSK: no deformities, contusions. Joints are without erythema or swelling.  Skin:  Warm, no rashes or suspicious lesions noted Neurologic:    Mental status is normal. Gross motor and sensory exams are normal. Normal gait. No tremor Breast Exam: No mass, skin retraction or nipple discharge is appreciated in either breast. No axillary adenopathy. Fibrocystic changes are not noted    Commons side effects, risks, benefits, and alternatives for medications and treatment plan prescribed today were discussed, and the patient expressed understanding of the given instructions. Patient is instructed to call or message via MyChart if he/she has any questions or concerns regarding our treatment plan. No barriers to understanding were  identified. We discussed Red Flag symptoms and signs in detail. Patient expressed understanding regarding what to do in case of urgent or emergency type symptoms.  Medication list was reconciled, printed and provided to the patient in AVS. Patient instructions and summary information was reviewed with the patient as documented in the AVS. This note was prepared with assistance of Dragon voice recognition software. Occasional wrong-word or sound-a-like substitutions may have occurred due to the inherent limitations of voice recognition software .

## 2024-02-28 ENCOUNTER — Ambulatory Visit: Payer: Self-pay | Admitting: Family Medicine

## 2024-02-28 DIAGNOSIS — E1165 Type 2 diabetes mellitus with hyperglycemia: Secondary | ICD-10-CM

## 2024-02-28 MED ORDER — METFORMIN HCL 1000 MG PO TABS: 1000.0000 mg | ORAL_TABLET | Freq: Two times a day (BID) | ORAL | 3 refills | Status: DC

## 2024-02-28 NOTE — Progress Notes (Signed)
 See mychart note Dear Ms. Markowicz, Your blood test results now show uncontrolled diabetes with an A1c of >7.0.  It is now time to treat the diabetes more aggressively and manage all the parts that can be associated with diabetes.  First, please work on Sales executive. Second, I will increase the dose of the metformin . Please schedule a f/u visit in 3 months to recheck your levels and we will discuss other management strategies at that time. Please come to that visit fasting so I can recheck your cholesterol fasting.  If you'd like to come in sooner to go over this new diagnosis and management strategies, please schedule a visit.  Thank you. Sincerely, Dr. Jodie

## 2024-04-11 ENCOUNTER — Other Ambulatory Visit: Payer: Self-pay | Admitting: Family Medicine

## 2024-04-11 ENCOUNTER — Telehealth: Payer: Self-pay

## 2024-04-11 ENCOUNTER — Other Ambulatory Visit: Payer: Self-pay

## 2024-04-11 MED ORDER — METFORMIN HCL 1000 MG PO TABS: 1000.0000 mg | ORAL_TABLET | Freq: Two times a day (BID) | ORAL | 0 refills | Status: DC

## 2024-04-11 NOTE — Telephone Encounter (Signed)
 Copied from CRM #[id number]. Topic: Clinical - Medication Question >> Apr 11, 2024  3:44 PM Angelica Harris wrote: Reason for CRM: Patient would like to know if Dr. Jodie could send a 10 day supply of metFORMIN  (GLUCOPHAGE ) 1000 MG tablet to  [id number] - [place], [place] - [address] until she receives her 3 mo supply. On last pill today. If possible, she would like a call back today letting her know if script may be sent or not   ----------------------------------------------------------------------- From previous Reason for Contact - Medication Refill: Medication: metFORMIN  (GLUCOPHAGE ) 1000 MG tablet  Has the patient contacted their pharmacy? Yes (Agent: If no, request that the patient contact the pharmacy for the refill. If patient does not wish to contact the pharmacy document the reason why and proceed with request.) (Agent: If yes, when and what did the pharmacy advise?)  This is the patient's preferred pharmacy:    [id number] - [place], [place] - [address] [place] [address] Phone: [phone] Fax: [phone]  Is this the correct pharmacy for this prescription?   If no, delete pharmacy and type the correct one.   Has the prescription been filled recently?    Is the patient out of the medication?    Has the patient been seen for an appointment in the last year OR does the patient have an upcoming appointment?    Can we respond through MyChart?    Agent: Please be advised that Rx refills may take up to 3 business days. We ask that you follow-up with your pharmacy.

## 2024-04-16 ENCOUNTER — Other Ambulatory Visit: Payer: Self-pay

## 2024-04-16 MED ORDER — METFORMIN HCL 1000 MG PO TABS: 1000.0000 mg | ORAL_TABLET | Freq: Two times a day (BID) | ORAL | 0 refills | Status: AC

## 2025-02-26 ENCOUNTER — Encounter: Admitting: Family Medicine
